# Patient Record
Sex: Male | Born: 1965
Health system: Southern US, Community
[De-identification: ages and names within clinical notes are randomized; demographics above are authoritative.]

## PROBLEM LIST (undated history)

## (undated) DIAGNOSIS — I10 Essential (primary) hypertension: Secondary | ICD-10-CM

## (undated) DIAGNOSIS — E119 Type 2 diabetes mellitus without complications: Secondary | ICD-10-CM

---

## 2005-04-28 ENCOUNTER — Emergency Department (HOSPITAL_COMMUNITY): Admission: EM | Admit: 2005-04-28 | Discharge: 2005-04-28 | Payer: Self-pay | Admitting: Emergency Medicine

## 2006-10-08 ENCOUNTER — Emergency Department (HOSPITAL_COMMUNITY): Admission: EM | Admit: 2006-10-08 | Discharge: 2006-10-09 | Payer: Self-pay | Admitting: Emergency Medicine

## 2008-01-04 ENCOUNTER — Emergency Department (HOSPITAL_COMMUNITY): Admission: EM | Admit: 2008-01-04 | Discharge: 2008-01-04 | Payer: Self-pay | Admitting: Family Medicine

## 2013-02-07 ENCOUNTER — Emergency Department (HOSPITAL_COMMUNITY): Payer: BC Managed Care – PPO

## 2013-02-07 ENCOUNTER — Emergency Department (HOSPITAL_COMMUNITY)
Admission: EM | Admit: 2013-02-07 | Discharge: 2013-02-07 | Disposition: A | Discharge status: Home / Self Care | Payer: BC Managed Care – PPO | Attending: Emergency Medicine | Admitting: Emergency Medicine

## 2013-02-07 DIAGNOSIS — I10 Essential (primary) hypertension: Secondary | ICD-10-CM | POA: Insufficient documentation

## 2013-02-07 DIAGNOSIS — K219 Gastro-esophageal reflux disease without esophagitis: Secondary | ICD-10-CM | POA: Insufficient documentation

## 2013-02-07 LAB — COMPREHENSIVE METABOLIC PANEL
ALT: 11 U/L (ref 0–53)
AST: 15 U/L (ref 0–37)
Albumin: 3.9 g/dL (ref 3.5–5.2)
Alkaline Phosphatase: 59 U/L (ref 39–117)
CO2: 26 mEq/L (ref 19–32)
Calcium: 9.3 mg/dL (ref 8.4–10.5)
Creatinine, Ser: 1.14 mg/dL (ref 0.50–1.35)
GFR calc Af Amer: 88 mL/min — ABNORMAL LOW (ref >90)
GFR calc non Af Amer: 76 mL/min — ABNORMAL LOW (ref >90)
Glucose, Bld: 111 mg/dL — ABNORMAL HIGH (ref 70–99)
Potassium: 3.6 mEq/L (ref 3.5–5.1)
Sodium: 138 mEq/L (ref 135–145)
Total Bilirubin: 0.5 mg/dL (ref 0.3–1.2)
Total Protein: 7.5 g/dL (ref 6.0–8.3)

## 2013-02-07 LAB — CBC
HCT: 41.4 % (ref 39.0–52.0)
Hemoglobin: 14.5 g/dL (ref 13.0–17.0)
MCHC: 35 g/dL (ref 30.0–36.0)
MCV: 91.4 fL (ref 78.0–100.0)
RBC: 4.53 MIL/uL (ref 4.22–5.81)
WBC: 11.2 10*3/uL — ABNORMAL HIGH (ref 4.0–10.5)

## 2013-02-07 LAB — POCT I-STAT TROPONIN I: Troponin i, poc: 0 ng/mL (ref 0.00–0.08)

## 2013-02-07 NOTE — ED Provider Notes (Signed)
CSN: [id number]     Arrival date & time 02/07/13  0331 History   First MD Initiated Contact with Patient 02/07/13 [phone]     Chief Complaint  Patient presents with  . Dysphagia    The history is provided by the patient.   Pt reports that for past 3 nights he will wake up with burning in his chest that radiates into his neck and will cause reflux into his mouth.  It will last about 30 minutes then resolve and is improved with sitting up.  He also reports that for the past 24 hours he has had difficulty swallowing and had some burning in his chest along with swallowing.  No sob.  No fever/cough.  No drooling.  He denies sore throat He reports he just started taking prilosec and pepcid yesterday He denies NSAID abuse   He also mentions left scapular pain for past several weeks, no injury, pain seems worse with movement certain positions.  PMH - HTN Soc hx - no etoh use History  Substance Use Topics  . Smoking status: Not on file  . Smokeless tobacco: Not on file  . Alcohol Use: Not on file    Review of Systems  Constitutional: Negative for fever.  Respiratory: Negative for cough, choking and shortness of breath.   Gastrointestinal: Negative for vomiting and blood in stool.  Neurological: Negative for weakness.  All other systems reviewed and are negative.    Allergies  Shellfish allergy  Home Medications  No current outpatient prescriptions on file. BP 125/83  Pulse 69  Temp(Src) 98.1 F (36.7 C) (Oral)  Resp 16  SpO2 99% Physical Exam CONSTITUTIONAL: Well developed/well nourished HEAD: Normocephalic/atraumatic EYES: EOMI/PERRL ENMT: Mucous membranes moist NECK: supple no meningeal signs SPINE:entire spine nontender CV: S1/S2 noted, no murmurs/rubs/gallops noted LUNGS: Lungs are clear to auscultation bilaterally, no apparent distress ABDOMEN: soft, nontender, no rebound or guarding NEURO: Pt is awake/alert, moves all extremitiesx4 EXTREMITIES: pulses normal, full  ROM, mild tenderness to left scapula, no bruising noted SKIN: warm, color normal PSYCH: no abnormalities of mood noted  ED Course  Procedures  Labs Review Labs Reviewed  CBC - Abnormal; Notable for the following:    WBC 11.2 (*)    All other components within normal limits  COMPREHENSIVE METABOLIC PANEL - Abnormal; Notable for the following:    Glucose, Bld 111 (*)    GFR calc non Af Amer 76 (*)    GFR calc Af Amer 88 (*)    All other components within normal limits  POCT I-STAT TROPONIN I   Imaging Review Dg Chest 2 View  02/07/2013   CLINICAL DATA:  Dysphagia.  Back pain.  EXAM: CHEST  2 VIEW  COMPARISON:  03/07/2011  FINDINGS: Band of opacity in the right middle lobe, consistent with atelectasis. No consolidation, edema, effusion, or pneumothorax. Normal heart size and mediastinal contours. Remote lateral left rib fractures.  IMPRESSION: Mild right middle lobe atelectasis.   Electronically Signed   By: Tiburcio Pea   On: 02/07/2013 04:57  pt well appearing, no distress.  He reports symptoms improved when he sat up.  He reports he eats heavy meals and will go to bed.  He self diagnosed himself with reflux and I suspect this may be his issue.  I told him to continue prilosec and he may need referral to [facility] I doubt ACS at this time.  Stable for d/c home  MDM  No diagnosis found. Nursing notes including past medical  history and social history reviewed and considered in documentation xrays reviewed and considered Labs/vital reviewed and considered    Date: 02/07/2013 0338am  Rate: 75  Rhythm: normal sinus rhythm  QRS Axis: normal  Intervals: normal  ST/T Wave abnormalities: early repolarization  Conduction Disutrbances:none  Narrative Interpretation:   Old EKG Reviewed: none available at time of interpretation    Joya Gaskins, MD 02/07/13 [phone]

## 2013-02-07 NOTE — ED Notes (Signed)
Pt reports that he woke from deep sleep with reflux and difficulty swallowing, with left side shoulder pain that radiates to left neck. Swallowing difficulty resolved. Denies nausea, vomiting, SOB, weakness, diaphoresis. Pain exacerbated by nothing and relived by nothing.

## 2014-11-30 ENCOUNTER — Encounter (HOSPITAL_COMMUNITY): Payer: Self-pay | Admitting: Emergency Medicine

## 2014-11-30 ENCOUNTER — Emergency Department (HOSPITAL_COMMUNITY)
Admission: EM | Admit: 2014-11-30 | Discharge: 2014-11-30 | Disposition: A | Discharge status: Home / Self Care | Payer: BC Managed Care – PPO | Source: Home / Self Care

## 2014-11-30 DIAGNOSIS — J039 Acute tonsillitis, unspecified: Secondary | ICD-10-CM | POA: Diagnosis not present

## 2014-11-30 DIAGNOSIS — J029 Acute pharyngitis, unspecified: Secondary | ICD-10-CM

## 2014-11-30 HISTORY — DX: Essential (primary) hypertension: I10

## 2014-11-30 MED ORDER — IPRATROPIUM BROMIDE 0.06 % NA SOLN: 2.0000 | Freq: Four times a day (QID) | NASAL | Status: DC

## 2014-11-30 MED ORDER — AMOXICILLIN 500 MG PO CAPS: 500.0000 mg | ORAL_CAPSULE | Freq: Two times a day (BID) | ORAL | Status: DC

## 2014-11-30 NOTE — Discharge Instructions (Signed)
The cause of your symptoms are likely from a virus but may be from strep throat. Your initial Strep test was negative also this for culture and call you if it is positive. Please start the nasal Atrovent as well as 800 mg of ibuprofen every 6-8 hours as well as nasal saline and antibiotic allergy medicine such as Zyrtec. Symptoms do not improve you may start the antibiotics.

## 2014-11-30 NOTE — ED Provider Notes (Signed)
CSN: [id number]     Arrival date & time 11/30/14  1851 History   None    No chief complaint on file.  (Consider location/radiation/quality/duration/timing/severity/associated sxs/prior Treatment) HPI  3 days of sore throat, fevers chills,. Denies cough, shortness breath, chest pain, palpitations, rash, neck stiffness, symptoms are constant and getting worse ibuprofen 400 mg without improvement. Difficult to swallow.   No past medical history on file. No past surgical history on file. No family history on file. History  Substance Use Topics  . Smoking status: Not on file  . Smokeless tobacco: Not on file  . Alcohol Use: Not on file    Review of Systems Per HPI with all other pertinent systems negative.   Allergies  Shellfish allergy  Home Medications   Prior to Admission medications   Medication Sig Start Date End Date Taking? Authorizing Provider  albuterol (PROVENTIL HFA;VENTOLIN HFA) 108 (90 BASE) MCG/ACT inhaler Inhale 2 puffs into the lungs every 6 (six) hours as needed for wheezing.    Historical Provider, MD  amLODipine-benazepril (LOTREL) 10-40 MG per capsule Take 1 capsule by mouth daily.    Historical Provider, MD  amoxicillin (AMOXIL) 500 MG capsule Take 1 capsule (500 mg total) by mouth 2 (two) times daily. 11/30/14   Waldemar Dickens, MD  beclomethasone (QVAR) 80 MCG/ACT inhaler Inhale 1 puff into the lungs 2 (two) times daily.    Historical Provider, MD  EPINEPHrine (EPI-PEN) 0.3 mg/0.3 mL SOAJ injection Inject 0.3 mg into the muscle once.    Historical Provider, MD  hydrochlorothiazide (HYDRODIURIL) 25 MG tablet Take 12.5 mg by mouth daily.    Historical Provider, MD  ipratropium (ATROVENT) 0.06 % nasal spray Place 2 sprays into both nostrils 4 (four) times daily. 11/30/14   Waldemar Dickens, MD   BP 140/88 mmHg  Pulse 98  Temp(Src) 98.5 F (36.9 C) (Oral)  Resp 20  SpO2 96% Physical Exam Physical Exam  Constitutional: oriented to person, place, and time.  appears well-developed and well-nourished. No distress.  HENT:  Tonsils 2-3+ with copious exudate, anterior cervical lymphadenopathy, maxillary and frontal sinus tenderness to palpation. Head: Normocephalic and atraumatic.  Eyes: EOMI. PERRL.  Neck: Normal range of motion.  Cardiovascular: RRR, no m/r/g, 2+ distal pulses,  Pulmonary/Chest: Effort normal and breath sounds normal. No respiratory distress.  Abdominal: Soft. Bowel sounds are normal. NonTTP, no distension.  Musculoskeletal: Normal range of motion. Non ttp, no effusion.  Neurological: alert and oriented to person, place, and time.  Skin: Skin is warm. No rash noted. non diaphoretic.  Psychiatric: normal mood and affect. behavior is normal. Judgment and thought content normal.   ED Course  Procedures (including critical care time) Labs Review Labs Reviewed - No data to display  Imaging Review No results found.   MDM   1. Sore throat   2. Tonsillitis    Antibiotics, nasal Atrovent, strep culture sent, NSAIDs.    Waldemar Dickens, MD 11/30/14 2039

## 2014-11-30 NOTE — ED Notes (Signed)
Pt has been suffering from a sore throat and nasal congestion for four days.  Pt denies any fever.

## 2014-12-02 LAB — CULTURE, GROUP A STREP

## 2015-04-05 ENCOUNTER — Ambulatory Visit: Payer: BC Managed Care – PPO | Admitting: Skilled Nursing Facility1

## 2016-05-22 ENCOUNTER — Other Ambulatory Visit: Payer: Self-pay | Admitting: Internal Medicine

## 2016-05-22 ENCOUNTER — Ambulatory Visit
Admission: RE | Admit: 2016-05-22 | Discharge: 2016-05-22 | Disposition: A | Discharge status: Home / Self Care | Payer: BC Managed Care – PPO | Source: Ambulatory Visit | Attending: Internal Medicine | Admitting: Internal Medicine

## 2016-05-22 DIAGNOSIS — R0789 Other chest pain: Secondary | ICD-10-CM

## 2016-08-15 ENCOUNTER — Other Ambulatory Visit: Payer: Self-pay | Admitting: Gastroenterology

## 2016-10-01 ENCOUNTER — Ambulatory Visit (HOSPITAL_COMMUNITY): Admit: 2016-10-01 | Payer: BC Managed Care – PPO | Admitting: Gastroenterology

## 2016-10-01 ENCOUNTER — Encounter (HOSPITAL_COMMUNITY): Payer: Self-pay

## 2016-10-01 SURGERY — COLONOSCOPY WITH PROPOFOL
Anesthesia: Monitor Anesthesia Care

## 2016-10-21 ENCOUNTER — Encounter (HOSPITAL_COMMUNITY): Payer: Self-pay | Admitting: *Deleted

## 2016-10-22 ENCOUNTER — Ambulatory Visit (HOSPITAL_COMMUNITY)
Admission: RE | Admit: 2016-10-22 | Discharge: 2016-10-22 | Disposition: A | Discharge status: Home / Self Care | Payer: BC Managed Care – PPO | Source: Ambulatory Visit | Attending: Gastroenterology | Admitting: Gastroenterology

## 2016-10-22 ENCOUNTER — Encounter (HOSPITAL_COMMUNITY): Payer: Self-pay | Admitting: *Deleted

## 2016-10-22 ENCOUNTER — Ambulatory Visit (HOSPITAL_COMMUNITY): Payer: BC Managed Care – PPO | Admitting: Certified Registered Nurse Anesthetist

## 2016-10-22 ENCOUNTER — Encounter (HOSPITAL_COMMUNITY)
Admission: RE | Disposition: A | Discharge status: Home / Self Care | Payer: Self-pay | Source: Ambulatory Visit | Attending: Gastroenterology

## 2016-10-22 DIAGNOSIS — I10 Essential (primary) hypertension: Secondary | ICD-10-CM | POA: Diagnosis not present

## 2016-10-22 DIAGNOSIS — Z1211 Encounter for screening for malignant neoplasm of colon: Secondary | ICD-10-CM | POA: Insufficient documentation

## 2016-10-22 DIAGNOSIS — E119 Type 2 diabetes mellitus without complications: Secondary | ICD-10-CM | POA: Insufficient documentation

## 2016-10-22 DIAGNOSIS — D125 Benign neoplasm of sigmoid colon: Secondary | ICD-10-CM | POA: Diagnosis not present

## 2016-10-22 DIAGNOSIS — Z87891 Personal history of nicotine dependence: Secondary | ICD-10-CM | POA: Insufficient documentation

## 2016-10-22 DIAGNOSIS — E7800 Pure hypercholesterolemia, unspecified: Secondary | ICD-10-CM | POA: Insufficient documentation

## 2016-10-22 HISTORY — DX: Type 2 diabetes mellitus without complications: E11.9

## 2016-10-22 HISTORY — PX: COLONOSCOPY WITH PROPOFOL: SHX5780

## 2016-10-22 LAB — GLUCOSE, CAPILLARY: Glucose-Capillary: 111 mg/dL — ABNORMAL HIGH (ref 65–99)

## 2016-10-22 SURGERY — COLONOSCOPY WITH PROPOFOL
Anesthesia: Monitor Anesthesia Care

## 2016-10-22 MED ORDER — ONDANSETRON HCL 4 MG/2ML IJ SOLN
INTRAMUSCULAR | Status: DC | PRN
  Administered 2016-10-22: 4 mg via INTRAVENOUS

## 2016-10-22 MED ORDER — LIDOCAINE 2% (20 MG/ML) 5 ML SYRINGE
INTRAMUSCULAR | Status: DC | PRN
  Administered 2016-10-22: 40 mg via INTRAVENOUS

## 2016-10-22 MED ORDER — LACTATED RINGERS IV SOLN
INTRAVENOUS | Status: DC | PRN
  Administered 2016-10-22: 09:00:00 via INTRAVENOUS

## 2016-10-22 MED ORDER — ONDANSETRON HCL 4 MG/2ML IJ SOLN
INTRAMUSCULAR | Status: AC
  Filled 2016-10-22: qty 2

## 2016-10-22 MED ORDER — PROPOFOL 10 MG/ML IV BOLUS
INTRAVENOUS | Status: AC
  Filled 2016-10-22: qty 20

## 2016-10-22 MED ORDER — PROPOFOL 500 MG/50ML IV EMUL
INTRAVENOUS | Status: DC | PRN
  Administered 2016-10-22: 100 ug/kg/min via INTRAVENOUS

## 2016-10-22 MED ORDER — PROPOFOL 10 MG/ML IV BOLUS
INTRAVENOUS | Status: AC
  Filled 2016-10-22: qty 40

## 2016-10-22 SURGICAL SUPPLY — 22 items

## 2016-10-22 NOTE — Discharge Instructions (Signed)
Colonoscopy, Adult, Care After  This sheet gives you information about how to care for yourself after your procedure. Your health care provider may also give you more specific instructions. If you have problems or questions, contact your health care provider.  What can I expect after the procedure?  After the procedure, it is common to have:  · A small amount of blood in your stool for 24 hours after the procedure.  · Some gas.  · Mild abdominal cramping or bloating.    Follow these instructions at home:  General instructions    · For the first 24 hours after the procedure:  ? Do not drive or use machinery.  ? Do not sign important documents.  ? Do not drink alcohol.  ? Do your regular daily activities at a slower pace than normal.  ? Eat soft, easy-to-digest foods.  ? Rest often.  · Take over-the-counter or prescription medicines only as told by your health care provider.  · It is up to you to get the results of your procedure. Ask your health care provider, or the department performing the procedure, when your results will be ready.  Relieving cramping and bloating  · Try walking around when you have cramps or feel bloated.  · Apply heat to your abdomen as told by your health care provider. Use a heat source that your health care provider recommends, such as a moist heat pack or a heating pad.  ? Place a towel between your skin and the heat source.  ? Leave the heat on for 20-30 minutes.  ? Remove the heat if your skin turns bright red. This is especially important if you are unable to feel pain, heat, or cold. You may have a greater risk of getting burned.  Eating and drinking  · Drink enough fluid to keep your urine clear or pale yellow.  · Resume your normal diet as instructed by your health care provider. Avoid heavy or fried foods that are hard to digest.  · Avoid drinking alcohol for as long as instructed by your health care provider.  Contact a health care provider if:  · You have blood in your stool 2-3  days after the procedure.  Get help right away if:  · You have more than a small spotting of blood in your stool.  · You pass large blood clots in your stool.  · Your abdomen is swollen.  · You have nausea or vomiting.  · You have a fever.  · You have increasing abdominal pain that is not relieved with medicine.  This information is not intended to replace advice given to you by your health care provider. Make sure you discuss any questions you have with your health care provider.  Document Released: 12/19/2003 Document Revised: 01/29/2016 Document Reviewed: 07/18/2015  Elsevier Interactive Patient Education © 2018 [employer]

## 2016-10-22 NOTE — Anesthesia Postprocedure Evaluation (Signed)
Anesthesia Post Note  Patient: Ronald Espinoza  Procedure(s) Performed: Procedure(s) (LRB): COLONOSCOPY WITH PROPOFOL (N/A)     Patient location during evaluation: Endoscopy Anesthesia Type: MAC Level of consciousness: awake and alert Pain management: pain level controlled Vital Signs Assessment: post-procedure vital signs reviewed and stable Respiratory status: spontaneous breathing, nonlabored ventilation, respiratory function stable and patient connected to nasal cannula oxygen Cardiovascular status: stable and blood pressure returned to baseline Anesthetic complications: no    Last Vitals:  Vitals:   10/22/16 1010 10/22/16 1015  BP: 124/71   Pulse: 73 68  Resp: 16 16  Temp:  36.6 C    Last Pain:  Vitals:   10/22/16 0851  TempSrc: Oral                 Montez Hageman

## 2016-10-22 NOTE — Anesthesia Preprocedure Evaluation (Signed)
Anesthesia Evaluation  Patient identified by MRN, date of birth, ID band Patient awake    Reviewed: Allergy & Precautions, NPO status , Patient's Chart, lab work & pertinent test results  Airway Mallampati: II  TM Distance: >3 FB Neck ROM: Full    Dental no notable dental hx.    Pulmonary neg pulmonary ROS, former smoker,    Pulmonary exam normal breath sounds clear to auscultation       Cardiovascular hypertension, Pt. on medications negative cardio ROS Normal cardiovascular exam Rhythm:Regular Rate:Normal     Neuro/Psych negative neurological ROS  negative psych ROS   GI/Hepatic negative GI ROS, Neg liver ROS,   Endo/Other  negative endocrine ROSdiabetes, Type 2, Oral Hypoglycemic Agents  Renal/GU negative Renal ROS  negative genitourinary   Musculoskeletal negative musculoskeletal ROS (+)   Abdominal   Peds negative pediatric ROS (+)  Hematology negative hematology ROS (+)   Anesthesia Other Findings   Reproductive/Obstetrics negative OB ROS                             Anesthesia Physical Anesthesia Plan  ASA: II  Anesthesia Plan: MAC   Post-op Pain Management:    Induction: Intravenous  PONV Risk Score and Plan: 0  Airway Management Planned:   Additional Equipment:   Intra-op Plan:   Post-operative Plan:   Informed Consent: I have reviewed the patients History and Physical, chart, labs and discussed the procedure including the risks, benefits and alternatives for the proposed anesthesia with the patient or authorized representative who has indicated his/her understanding and acceptance.   Dental advisory given  Plan Discussed with: CRNA  Anesthesia Plan Comments:         Anesthesia Quick Evaluation

## 2016-10-22 NOTE — Transfer of Care (Signed)
Immediate Anesthesia Transfer of Care Note  Patient: Ronald Espinoza  Procedure(s) Performed: Procedure(s): COLONOSCOPY WITH PROPOFOL (N/A)  Patient Location: PACU  Anesthesia Type:MAC  Level of Consciousness:  sedated, patient cooperative and responds to stimulation  Airway & Oxygen Therapy:Patient Spontanous Breathing and Patient connected to face mask oxgen  Post-op Assessment:  Report given to PACU RN and Post -op Vital signs reviewed and stable  Post vital signs:  Reviewed and stable  Last Vitals:  Vitals:   10/22/16 0851  BP: (!) 146/92  Resp: (!) 21  Temp: 03.4 C    Complications: No apparent anesthesia complications

## 2016-10-22 NOTE — Op Note (Signed)
[facility] Patient Name: Ronald Espinoza Procedure Date: 10/22/2016 MRN: [id number] Attending MD: Garlan Fair , MD Date of Birth: 11-Jun-1965 CSN: [id number] Age: 51 Admit Type: Outpatient Procedure:                Colonoscopy Indications:              Screening for colorectal malignant neoplasm Providers:                Garlan Fair, MD, Kingsley Plan, RN, Lillie Fragmin, RN, Cherylynn Ridges, Technician, Christell Faith, CRNA Referring MD:              Medicines:                Propofol per Anesthesia Complications:            No immediate complications. Estimated Blood Loss:     Estimated blood loss was minimal. Procedure:                Pre-Anesthesia Assessment:                           - Prior to the procedure, a History and Physical                            was performed, and patient medications and                            allergies were reviewed. The patient's tolerance of                            previous anesthesia was also reviewed. The risks                            and benefits of the procedure and the sedation                            options and risks were discussed with the patient.                            All questions were answered, and informed consent                            was obtained. Prior Anticoagulants: The patient has                            taken aspirin, last dose was 1 day prior to                            procedure. ASA Grade Assessment: II - A patient  with mild systemic disease. After reviewing the                            risks and benefits, the patient was deemed in                            satisfactory condition to undergo the procedure.                           After obtaining informed consent, the colonoscope                            was passed under direct vision. Throughout the                            procedure,  the patient's blood pressure, pulse, and                            oxygen saturations were monitored continuously. The                            EC-[id number] ([id number]) scope was introduced through                            the anus and advanced to the the cecum, identified                            by appendiceal orifice and ileocecal valve. The                            colonoscopy was performed without difficulty. The                            patient tolerated the procedure well. The quality                            of the bowel preparation was good. The ileocecal                            valve, the appendiceal orifice and the rectum were                            photographed. Scope In: 9:25:11 AM Scope Out: 9:48:30 AM Scope Withdrawal Time: 0 hours 15 minutes 44 seconds  Total Procedure Duration: 0 hours 23 minutes 19 seconds  Findings:      The perianal and digital rectal examinations were normal.      A 4 mm polyp was found in the proximal sigmoid colon. The polyp was       semi-pedunculated. The polyp was removed with a cold snare. Resection       and retrieval were complete.      The exam was otherwise without abnormality. Impression:               - One 4 mm polyp in the proximal sigmoid colon,  removed with a cold snare. Resected and retrieved.                           - The examination was otherwise normal. Moderate Sedation:      N/A- Per Anesthesia Care Recommendation:           - Patient has a contact number available for                            emergencies. The signs and symptoms of potential                            delayed complications were discussed with the                            patient. Return to normal activities tomorrow.                            Written discharge instructions were provided to the                            patient.                           - Repeat colonoscopy date to be determined after                             pending pathology results are reviewed for                            surveillance.                           - Resume previous diet.                           - Continue present medications. Procedure Code(s):        --- Professional ---                           [id number], Colonoscopy, flexible; with removal of                            tumor(s), polyp(s), or other lesion(s) by snare                            technique Diagnosis Code(s):        --- Professional ---                           Z12.11, Encounter for screening for malignant                            neoplasm of colon                           D12.5, Benign neoplasm of sigmoid colon  CPT copyright 2016 American Medical Association. All rights reserved. The codes documented in this report are preliminary and upon coder review may  be revised to meet current compliance requirements. Earle Gell, MD Garlan Fair, MD 10/22/2016 9:53:53 AM This report has been signed electronically. Number of Addenda: 0

## 2016-10-22 NOTE — H&P (Signed)
Procedure: Baseline screening colonoscopy  History: The patient is a 51 year old male born 05/20/1966. He is scheduled to undergo his first screening colonoscopy with polypectomy to prevent colon cancer. There is no family history of colon cancer.  Past medical history: Hypertension. Hypercholesterolemia. Type 2 diabetes mellitus diagnosed in 2017.  Habits: The patient quit smoking cigarettes in 2012  Exam: The patient is alert and lying comfortably on the endoscopy stretcher. Abdomen is soft and nontender to palpation. Lungs are clear to auscultation. Cardiac exam reveals a regular rhythm.  Plan: Proceed with baseline screening colonoscopy

## 2016-10-23 ENCOUNTER — Encounter (HOSPITAL_COMMUNITY): Payer: Self-pay | Admitting: Gastroenterology

## 2016-12-23 ENCOUNTER — Encounter: Payer: BC Managed Care – PPO | Attending: Internal Medicine | Admitting: Registered"

## 2016-12-23 ENCOUNTER — Encounter: Payer: Self-pay | Admitting: Registered"

## 2016-12-23 DIAGNOSIS — E119 Type 2 diabetes mellitus without complications: Secondary | ICD-10-CM | POA: Insufficient documentation

## 2016-12-23 DIAGNOSIS — Z713 Dietary counseling and surveillance: Secondary | ICD-10-CM | POA: Diagnosis not present

## 2016-12-23 DIAGNOSIS — I10 Essential (primary) hypertension: Secondary | ICD-10-CM | POA: Insufficient documentation

## 2016-12-23 NOTE — Progress Notes (Signed)
Diabetes Self-Management Education  Visit Type: First/Initial  Appt. Start Time: 1415 Appt. End Time: 5329  12/23/2016  Mr. Steffanie Rainwater, identified by name and date of birth, is a 51 y.o. male with a diagnosis of Diabetes: Type 2.   ASSESSMENT Pt states his A1c was 7% in Jan, per chart it is now 6.8%. Pt states since his diagnosis he has started walking more at work- 6-10K steps 1-2 days per week, has cut back on fried food and sugary drinks.  Pt states he is a fast eater. Pt reports he likes to cook, but eats out often.      Diabetes Self-Management Education - 12/23/16 1424      Visit Information   Visit Type First/Initial     Initial Visit   Diabetes Type Type 2   Are you currently following a meal plan? No   Are you taking your medications as prescribed? Yes   Date Diagnosed June 2017     Health Coping   How would you rate your overall health? Fair     Psychosocial Assessment   Patient Belief/Attitude about Diabetes Motivated to manage diabetes   How often do you need to have someone help you when you read instructions, pamphlets, or other written materials from your doctor or pharmacy? 1 - Never   What is the last grade level you completed in school? 2 yrs trade school     Complications   Last HgB A1C per patient/outside source 6.8 %  per referral labs   How often do you check your blood sugar? 0 times/day (not testing)   Have you had a dilated eye exam in the past 12 months? Yes   Have you had a dental exam in the past 12 months? Yes   Are you checking your feet? Yes   How many days per week are you checking your feet? 5     Dietary Intake   Breakfast none OR [place] biscuit ham & egg, hashbrown, coffee   Snack (morning) none (try) was taking fruit OR almonds (not too hungry)   Lunch left overs OR j[employer] OR burger, fries, used to get drink   Snack (afternoon) nachos salsa OR cheese   Dinner Fied chicken collard greens OR risoto OR [place] Lamb Stew  OR [place] [employer] OR olive garden   Snack (evening) toast OR meat & cheese   Beverage(s) water, coffee, OJ, milk 2%     Exercise   Exercise Type Light (walking / raking leaves)   How many days per week to you exercise? 2   How many minutes per day do you exercise? 15   Total minutes per week of exercise 30     Patient Education   Previous Diabetes Education No   Disease state  Definition of diabetes, type 1 and 2, and the diagnosis of diabetes;Factors that contribute to the development of diabetes   Nutrition management  Role of diet in the treatment of diabetes and the relationship between the three main macronutrients and blood glucose level;Carbohydrate counting   Physical activity and exercise  Role of exercise on diabetes management, blood pressure control and cardiac health.   Monitoring Identified appropriate SMBG and/or A1C goals.   Chronic complications Relationship between chronic complications and blood glucose control     Individualized Goals (developed by patient)   Nutrition General guidelines for healthy choices and portions discussed   Physical Activity Exercise 5-7 days per week     Outcomes   Expected  Outcomes Demonstrated interest in learning. Expect positive outcomes   Future DMSE 4-6 wks   Program Status Not Completed    Individualized Plan for Diabetes Self-Management Training:   Learning Objective:  Patient will have a greater understanding of diabetes self-management. Patient education plan is to attend individual and/or group sessions per assessed needs and concerns.  Patient Instructions  Consider having a English muffin instead of biscuit and not having the hashbrowns Aim to eat mindfully at least one meal per week  Aim to balance meals with carbohydrates and protein Aim to increase your physical activity daily as tolerated Consider using alternative seasonings  Expected Outcomes:  Demonstrated interest in learning. Expect positive  outcomes  Education material provided: Living Well with Diabetes, A1C conversion sheet, My Plate, Carbohydrate counting sheet and No sodium seasonings  If problems or questions, patient to contact team via:  Phone and MyChart  Future DSME appointment: 4-6 wks

## 2016-12-23 NOTE — Patient Instructions (Addendum)
Consider having a English muffin instead of biscuit and not having the hashbrowns Aim to eat mindfully at least one meal per week  Aim to balance meals with carbohydrates and protein Aim to increase your physical activity daily as tolerated Consider using alternative seasonings

## 2017-01-27 ENCOUNTER — Encounter: Payer: BC Managed Care – PPO | Attending: Internal Medicine | Admitting: Registered"

## 2017-01-27 DIAGNOSIS — Z713 Dietary counseling and surveillance: Secondary | ICD-10-CM | POA: Insufficient documentation

## 2017-01-27 DIAGNOSIS — I10 Essential (primary) hypertension: Secondary | ICD-10-CM | POA: Insufficient documentation

## 2017-01-27 DIAGNOSIS — E119 Type 2 diabetes mellitus without complications: Secondary | ICD-10-CM | POA: Diagnosis not present

## 2017-01-27 NOTE — Progress Notes (Signed)
Diabetes Self-Management Education  Visit Type: Follow-up  Appt. Start Time: 1400 Appt. End Time: 1430  01/27/2017  [name], identified by name and date of birth, is a 51 y.o. male with a diagnosis of Diabetes:  .   ASSESSMENT Pt states he has not started working out yet. Pt reports when he gets home from work he likes to relax. Pt states he has been snacking maybe out of boredom, chips and salsa. RD discussed other options and getting a hobby may be a good idea. Pt states he started having sweet tea at night as he was enjoying perfecting a recipe. Pt states they had pasta for dinner, but he ate smaller serving size that he had in the past. Pt misunderstood advice about snacks and has been eating fruit alone for snacks.      Diabetes Self-Management Education - 01/27/17 1727      Visit Information   Visit Type Follow-up     Patient Education   Nutrition management  Food label reading, portion sizes and measuring food.   Physical activity and exercise  Role of exercise on diabetes management, blood pressure control and cardiac health.   Monitoring Identified appropriate SMBG and/or A1C goals.   Psychosocial adjustment Worked with patient to identify barriers to care and solutions     Individualized Goals (developed by patient)   Nutrition Follow meal plan discussed   Physical Activity Exercise 3-5 times per week     Patient Self-Evaluation of Goals - Patient rates self as meeting previously set goals (% of time)   Nutrition 25 - 50%   Physical Activity < 25%   Medications >75%   Monitoring < 25%     Outcomes   Expected Outcomes Demonstrated interest in learning. Expect positive outcomes   Future DMSE 2 months   Program Status Completed     Subsequent Visit   Since your last visit have you continued or begun to take your medications as prescribed? Yes   Since your last visit, are you checking your blood glucose at least once a day? No  meter not working, RD encouraged  to get a new one    Individualized Plan for Diabetes Self-Management Training:   Learning Objective:  Patient will have a greater understanding of diabetes self-management. Patient education plan is to attend individual and/or group sessions per assessed needs and concerns.   Patient Instructions  Snacks - make sure you have enough protein to balance your carbs Consider checking your blood sugar a few times a week Consider working the exercise into your routine  Expected Outcomes:  Demonstrated interest in learning. Expect positive outcomes  Education material provided: Food label handouts and A1C conversion sheet  If problems or questions, patient to contact team via:  Phone  Future DSME appointment: 2 months

## 2017-01-27 NOTE — Patient Instructions (Addendum)
Snacks - make sure you have enough protein to balance your carbs Consider checking your blood sugar a few times a week Consider working the exercise into your routine

## 2017-02-26 ENCOUNTER — Ambulatory Visit: Payer: BC Managed Care – PPO | Admitting: Registered"

## 2017-03-12 ENCOUNTER — Encounter (HOSPITAL_COMMUNITY): Payer: Self-pay | Admitting: Emergency Medicine

## 2017-03-12 ENCOUNTER — Ambulatory Visit (HOSPITAL_COMMUNITY)
Admission: EM | Admit: 2017-03-12 | Discharge: 2017-03-12 | Disposition: A | Discharge status: Home / Self Care | Payer: BC Managed Care – PPO | Attending: Family Medicine | Admitting: Family Medicine

## 2017-03-12 DIAGNOSIS — K649 Unspecified hemorrhoids: Secondary | ICD-10-CM | POA: Diagnosis not present

## 2017-03-12 NOTE — ED Triage Notes (Signed)
Pt reports external rectal bleeding, possibly from a hemorrhoid that started this morning.  He has had the hemorrhoid since Saturday.

## 2017-03-13 NOTE — ED Provider Notes (Signed)
  [place]   [id number] 03/12/17 Arrival Time: 8466  ASSESSMENT & PLAN:  1. Acute hemorrhoid    Observation. OTC ointment as needed. Colonoscopy this year; normal per patient report. If worsening will f/u with PCP or here. Reviewed expectations re: course of current medical issues. Questions answered. Outlined signs and symptoms indicating need for more acute intervention. Patient verbalized understanding. After Visit Summary given.   SUBJECTIVE:  Ronald Espinoza is a 51 y.o. male who presents with complaint of possible hemorrhoid. Noticed BRB on toilet paper this morning after straining with BM. Has been noticing hard stool for the past few days. Slight pain with BM. No n/v. Afebrile. No abdominal pain. Questions hemorrhoid. No OTC treatment.  ROS: As per HPI.   OBJECTIVE:  Vitals:   03/12/17 1422  BP: (!) 156/105  Pulse: 90  Temp: 98.9 F (37.2 C)  TempSrc: Oral  SpO2: 96%    General appearance: alert; no distress Eyes: PERRLA; EOMI; conjunctiva normal HENT: normocephalic; atraumatic; TMs normal; nasal mucosa normal; oral mucosa normal Neck: supple Lungs: clear to auscultation bilaterally Heart: regular rate and rhythm Abdomen: soft, non-tender; bowel sounds normal; no masses or organomegaly; no guarding or rebound tenderness GU: rectal exam with external hemorrhoid, non-thrombosed, some BRB Back: no CVA tenderness Extremities: no cyanosis or edema; symmetrical with no gross deformities Skin: warm and dry Neurologic: normal gait; normal symmetric reflexes Psychological: alert and cooperative; normal mood and affect  Allergies  Allergen Reactions  . Shellfish Allergy Shortness Of Breath and Itching    Past Medical History:  Diagnosis Date  . Diabetes mellitus without complication ([place])   . Hypertension    Social History   Social History  . Marital status: Married    Spouse name: N/A  . Number of children: N/A  . Years of education: N/A    Occupational History  . Not on file.   Social History Main Topics  . Smoking status: Former Smoker    Quit date: 10/23/2010  . Smokeless tobacco: Never Used  . Alcohol use No  . Drug use: No  . Sexual activity: Yes   Other Topics Concern  . Not on file   Social History Narrative  . No narrative on file    Past Surgical History:  Procedure Laterality Date  . COLONOSCOPY WITH PROPOFOL N/A 10/22/2016   Procedure: COLONOSCOPY WITH PROPOFOL;  Surgeon: Garlan Fair, MD;  Location: [facility];  Service: Endoscopy;  Laterality: N/AVanessa Kick, MD 03/13/17 [phone]

## 2018-01-12 ENCOUNTER — Ambulatory Visit: Payer: BC Managed Care – PPO | Admitting: Podiatry

## 2018-01-12 ENCOUNTER — Encounter: Payer: Self-pay | Admitting: Podiatry

## 2018-01-12 DIAGNOSIS — B351 Tinea unguium: Secondary | ICD-10-CM

## 2018-01-12 DIAGNOSIS — M79674 Pain in right toe(s): Secondary | ICD-10-CM

## 2018-01-12 DIAGNOSIS — E119 Type 2 diabetes mellitus without complications: Secondary | ICD-10-CM

## 2018-01-12 NOTE — Progress Notes (Signed)
This patient presents to the office with chief complaint of long thick nail right foot  and diabetic feet.  This patient  says there  is  no pain and discomfort in his  feet.  This patient says there are long thick painful nails big toe right foot.  This nail is painful walking and wearing shoes.  Patient has no history of infection or drainage from both feet.  Patient is unable to  self treat his own nails . This patient presents  to the office today for treatment of the  Long big toenail  and a foot evaluation due to history of  diabetes.  General Appearance  Alert, conversant and in no acute stress.  Vascular  Dorsalis pedis and posterior tibial  pulses are palpable  bilaterally.  Capillary return is within normal limits  bilaterally. Temperature is within normal limits  bilaterally.  Neurologic  Senn-Weinstein monofilament wire test within normal limits  bilaterally. Muscle power within normal limits bilaterally.  Nails Thick disfigured discolored nails with subungual debris  from hallux to fifth toes bilaterally. No evidence of bacterial infection or drainage bilaterally.  Orthopedic  No limitations of motion of motion feet .  No crepitus or effusions noted.  No bony pathology or digital deformities noted.  Skin  normotropic skin with no porokeratosis noted bilaterally.  No signs of infections or ulcers noted.     Onychomycosis  Diabetes with no foot complications  IE  Debride nails x 1.  A diabetic foot exam was performed and there is no evidence of any vascular or neurologic pathology.   RTC prn   Gardiner Barefoot DPM

## 2018-11-16 ENCOUNTER — Other Ambulatory Visit: Payer: Self-pay

## 2018-11-16 ENCOUNTER — Observation Stay
Admission: EM | Admit: 2018-11-16 | Discharge: 2018-11-18 | Disposition: A | Discharge status: Home / Self Care | Payer: BC Managed Care – PPO | Attending: Internal Medicine | Admitting: Internal Medicine

## 2018-11-16 ENCOUNTER — Emergency Department: Payer: BC Managed Care – PPO

## 2018-11-16 DIAGNOSIS — Z87891 Personal history of nicotine dependence: Secondary | ICD-10-CM | POA: Insufficient documentation

## 2018-11-16 DIAGNOSIS — I2510 Atherosclerotic heart disease of native coronary artery without angina pectoris: Principal | ICD-10-CM | POA: Insufficient documentation

## 2018-11-16 DIAGNOSIS — Z7984 Long term (current) use of oral hypoglycemic drugs: Secondary | ICD-10-CM | POA: Diagnosis not present

## 2018-11-16 DIAGNOSIS — R0789 Other chest pain: Secondary | ICD-10-CM | POA: Diagnosis present

## 2018-11-16 DIAGNOSIS — Z1159 Encounter for screening for other viral diseases: Secondary | ICD-10-CM | POA: Diagnosis not present

## 2018-11-16 DIAGNOSIS — Z79899 Other long term (current) drug therapy: Secondary | ICD-10-CM | POA: Insufficient documentation

## 2018-11-16 DIAGNOSIS — R079 Chest pain, unspecified: Secondary | ICD-10-CM | POA: Diagnosis present

## 2018-11-16 DIAGNOSIS — M25512 Pain in left shoulder: Secondary | ICD-10-CM | POA: Diagnosis not present

## 2018-11-16 DIAGNOSIS — E119 Type 2 diabetes mellitus without complications: Secondary | ICD-10-CM | POA: Diagnosis not present

## 2018-11-16 DIAGNOSIS — I10 Essential (primary) hypertension: Secondary | ICD-10-CM | POA: Diagnosis not present

## 2018-11-16 DIAGNOSIS — R7989 Other specified abnormal findings of blood chemistry: Secondary | ICD-10-CM | POA: Insufficient documentation

## 2018-11-16 DIAGNOSIS — R778 Other specified abnormalities of plasma proteins: Secondary | ICD-10-CM

## 2018-11-16 LAB — CBC
HCT: 43 % (ref 39.0–52.0)
Hemoglobin: 14.5 g/dL (ref 13.0–17.0)
MCH: 30.9 pg (ref 26.0–34.0)
MCHC: 33.7 g/dL (ref 30.0–36.0)
MCV: 91.7 fL (ref 80.0–100.0)
Platelets: 193 10*3/uL (ref 150–400)
RBC: 4.69 MIL/uL (ref 4.22–5.81)
RDW: 14.4 % (ref 11.5–15.5)
WBC: 10.2 10*3/uL (ref 4.0–10.5)
nRBC: 0 % (ref 0.0–0.2)

## 2018-11-16 LAB — BASIC METABOLIC PANEL
Anion gap: 8 (ref 5–15)
BUN: 25 mg/dL — ABNORMAL HIGH (ref 6–20)
CO2: 26 mmol/L (ref 22–32)
Calcium: 9 mg/dL (ref 8.9–10.3)
Chloride: 106 mmol/L (ref 98–111)
Creatinine, Ser: 1.35 mg/dL — ABNORMAL HIGH (ref 0.61–1.24)
GFR calc Af Amer: >60 mL/min (ref >60)
GFR calc non Af Amer: 60 mL/min — ABNORMAL LOW (ref >60)
Glucose, Bld: 137 mg/dL — ABNORMAL HIGH (ref 70–99)
Potassium: 3.5 mmol/L (ref 3.5–5.1)
Sodium: 140 mmol/L (ref 135–145)

## 2018-11-16 LAB — TROPONIN I (HIGH SENSITIVITY): Troponin I (High Sensitivity): 7 ng/L (ref <18)

## 2018-11-16 MED ORDER — ASPIRIN 81 MG PO CHEW
324.0000 mg | CHEWABLE_TABLET | Freq: Once | ORAL | Status: AC
  Administered 2018-11-16: 324 mg via ORAL
  Filled 2018-11-16: qty 4

## 2018-11-16 NOTE — ED Triage Notes (Signed)
Pt arrives to ED via POV from home with c/o left shoulder pain with radiation down the left arm x1 hr that started when he "received a phone call from work" that might "have stressed me out". Pt denies CP, no SHOB. Pt denies N/V/D; no diaphoresis. Pt denies cardiac h/x. Pt is A&O, in NAD; RR even, regular and unlabored.

## 2018-11-16 NOTE — H&P (Signed)
[facility] [place] NAME: Ronald Espinoza    MR#:  [id number]  DATE OF BIRTH:  1966-05-17  DATE OF ADMISSION:  11/16/2018  PRIMARY CARE PHYSICIAN: Wenda Low, MD   REQUESTING/REFERRING PHYSICIAN: Cherylann Banas, MD  CHIEF COMPLAINT:   Chief Complaint  Patient presents with  . Shoulder Pain    HISTORY OF PRESENT ILLNESS:  Ronald Espinoza  is a 53 y.o. male who presents with chief complaint as above.  Patient presents the ED after an episode of left-sided upper chest pain, shoulder pain, radiating down his left arm.  Pain has since resolved.  Initial troponin in the ED was negative, however second troponin became positive.  Hospitalist called for admission  PAST MEDICAL HISTORY:   Past Medical History:  Diagnosis Date  . Diabetes mellitus without complication ([place])   . Hypertension      PAST SURGICAL HISTORY:   Past Surgical History:  Procedure Laterality Date  . COLONOSCOPY WITH PROPOFOL N/A 10/22/2016   Procedure: COLONOSCOPY WITH PROPOFOL;  Surgeon: Garlan Fair, MD;  Location: [facility];  Service: Endoscopy;  Laterality: N/A;     SOCIAL HISTORY:   Social History   Tobacco Use  . Smoking status: Former Smoker    Quit date: 10/23/2010    Years since quitting: 8.0  . Smokeless tobacco: Never Used  Substance Use Topics  . Alcohol use: No     FAMILY HISTORY:    Family history reviewed and is non-contributory DRUG ALLERGIES:   Allergies  Allergen Reactions  . Shellfish Allergy Shortness Of Breath and Itching    MEDICATIONS AT HOME:   Prior to Admission medications   Medication Sig Start Date End Date Taking? Authorizing Provider  albuterol (PROVENTIL HFA;VENTOLIN HFA) 108 (90 BASE) MCG/ACT inhaler Inhale 2 puffs into the lungs every 6 (six) hours as needed for wheezing.    [provider]  amLODipine-benazepril (LOTREL) 10-40 MG per capsule Take 1 capsule by mouth daily.    [provider]   EPINEPHrine (EPI-PEN) 0.3 mg/0.3 mL SOAJ injection Inject 0.3 mg into the muscle once.    [provider]  hydrochlorothiazide (HYDRODIURIL) 25 MG tablet Take 12.5 mg by mouth daily.    [provider]  ipratropium (ATROVENT) 0.06 % nasal spray Place 2 sprays into both nostrils 4 (four) times daily. 11/30/14   Waldemar Dickens, MD  metFORMIN (GLUCOPHAGE) 1000 MG tablet  01/02/18   [provider]  PREVIDENT 5000 SENSITIVE 1.1-5 % PSTE TK UTD 01/08/18   [provider]    REVIEW OF SYSTEMS:  Review of Systems  Constitutional: Negative for chills, fever, malaise/fatigue and weight loss.  HENT: Negative for ear pain, hearing loss and tinnitus.   Eyes: Negative for blurred vision, double vision, pain and redness.  Respiratory: Negative for cough, hemoptysis and shortness of breath.   Cardiovascular: Positive for chest pain. Negative for palpitations, orthopnea and leg swelling.  Gastrointestinal: Negative for abdominal pain, constipation, diarrhea, nausea and vomiting.  Genitourinary: Negative for dysuria, frequency and hematuria.  Musculoskeletal: Negative for back pain, joint pain and neck pain.  Skin:       No acne, rash, or lesions  Neurological: Negative for dizziness, tremors, focal weakness and weakness.  Endo/Heme/Allergies: Negative for polydipsia. Does not bruise/bleed easily.  Psychiatric/Behavioral: Negative for depression. The patient is not nervous/anxious and does not have insomnia.      VITAL SIGNS:   Vitals:   11/16/18 2130 11/16/18 2200 11/16/18  2230 11/16/18 2300  BP: 122/82 124/87 (!) 141/98 130/85  Pulse: 73 68 72 67  Resp:      Temp:      TempSrc:      SpO2: 99% 96% 99% 96%  Weight:      Height:       Wt Readings from Last 3 Encounters:  11/16/18 113.4 kg  10/22/16 131.5 kg    PHYSICAL EXAMINATION:  Physical Exam  Vitals reviewed. Constitutional: He is oriented to person, place, and time. He appears well-developed and  well-nourished. No distress.  HENT:  Head: Normocephalic and atraumatic.  Mouth/Throat: Oropharynx is clear and moist.  Eyes: Pupils are equal, round, and reactive to light. Conjunctivae and EOM are normal. No scleral icterus.  Neck: Normal range of motion. Neck supple. No JVD present. No thyromegaly present.  Cardiovascular: Normal rate, regular rhythm and intact distal pulses. Exam reveals no gallop and no friction rub.  No murmur heard. Respiratory: Effort normal and breath sounds normal. No respiratory distress. He has no wheezes. He has no rales.  GI: Soft. Bowel sounds are normal. He exhibits no distension. There is no abdominal tenderness.  Musculoskeletal: Normal range of motion.        General: No edema.     Comments: No arthritis, no gout  Lymphadenopathy:    He has no cervical adenopathy.  Neurological: He is alert and oriented to person, place, and time. No cranial nerve deficit.  No dysarthria, no aphasia  Skin: Skin is warm and dry. No rash noted. No erythema.  Psychiatric: He has a normal mood and affect. His behavior is normal. Judgment and thought content normal.    LABORATORY PANEL:   CBC Recent Labs  Lab 11/16/18 1916  WBC 10.2  HGB 14.5  HCT 43.0  PLT 193   ------------------------------------------------------------------------------------------------------------------  Chemistries  Recent Labs  Lab 11/16/18 1916  NA 140  K 3.5  CL 106  CO2 26  GLUCOSE 137*  BUN 25*  CREATININE 1.35*  CALCIUM 9.0   ------------------------------------------------------------------------------------------------------------------  Cardiac Enzymes No results for input(s): TROPONINI in the last 168 hours. ------------------------------------------------------------------------------------------------------------------  RADIOLOGY:  Dg Chest 2 View  Result Date: 11/16/2018 CLINICAL DATA:  Chest, left shoulder and arm pain. EXAM: CHEST - 2 VIEW COMPARISON:   05/22/2016 and 02/07/2013 FINDINGS: The heart size and pulmonary vascularity are normal. Tiny area of atelectasis at the left lung base laterally. Old healed left lateral rib fractures. No acute bone abnormality. No effusions. IMPRESSION: No significant abnormalities. Electronically Signed   By: Lorriane Shire M.D.   On: 11/16/2018 20:30    EKG:   Orders placed or performed during the hospital encounter of 11/16/18  . EKG 12-Lead  . EKG 12-Lead  . ED EKG  . ED EKG    IMPRESSION AND PLAN:  Principal Problem:   Chest pain -symptoms have resolved.  Second troponin in the ED became positive.  Cycle cardiac enzymes tonight, get an echocardiogram and cardiology consult. Active Problems:   HTN (hypertension) -home dose antihypertensives   Diabetes ([place]) -sliding scale insulin  Chart review performed and case discussed with ED provider. Labs, imaging and/or ECG reviewed by provider and discussed with patient/family. Management plans discussed with the patient and/or family.  COVID-19 status: Test pending  DVT PROPHYLAXIS: SubQ lovenox   GI PROPHYLAXIS:  None  ADMISSION STATUS: Observation  CODE STATUS: Full  TOTAL TIME TAKING CARE OF THIS PATIENT: 40 minutes.   This patient was evaluated in the context of the  global COVID-19 pandemic, which necessitated consideration that the patient might be at risk for infection with the SARS-CoV-2 virus that causes COVID-19. Institutional protocols and algorithms that pertain to the evaluation of patients at risk for COVID-19 are in a state of rapid change based on information released by regulatory bodies including the CDC and federal and state organizations. These policies and algorithms were followed to the best of this provider's knowledge to date during the patient's care at this facility.  Ethlyn Daniels 11/16/2018, 11:36 PM  [facility]  Office  [phone]  CC: Primary care physician; Wenda Low, MD  Note:  This  document was prepared using Dragon voice recognition software and may include unintentional dictation errors.

## 2018-11-16 NOTE — ED Notes (Addendum)
ED TO INPATIENT HANDOFF REPORT  ED Nurse Name and Phone #: Karena Addison [phone] Name/Age/Gender Graylon Good 53 y.o. male Room/Bed: ED16A/ED16A  Code Status   Code Status: Not on file  Home/SNF/Other Home Patient oriented to: self, place, time and situation Is this baseline? Yes   Triage Complete: Triage complete  Chief Complaint Shoulder/Arm Pain  Triage Note Pt arrives to ED via POV from home with c/o left shoulder pain with radiation down the left arm x1 hr that started when he "received a phone call from work" that might "have stressed me out". Pt denies CP, no SHOB. Pt denies N/V/D; no diaphoresis. Pt denies cardiac h/x. Pt is A&O, in NAD; RR even, regular and unlabored.   Allergies Allergies  Allergen Reactions  . Shellfish Allergy Shortness Of Breath and Itching    Level of Care/Admitting Diagnosis ED Disposition    ED Disposition Condition Comment   Admit  The patient appears reasonably stabilized for admission considering the current resources, flow, and capabilities available in the ED at this time, and I doubt any other [facility] requiring further screening and/or treatment in the ED prior to admission is  present.       B Medical/Surgery History Past Medical History:  Diagnosis Date  . Diabetes mellitus without complication ([place])   . Hypertension    Past Surgical History:  Procedure Laterality Date  . COLONOSCOPY WITH PROPOFOL N/A 10/22/2016   Procedure: COLONOSCOPY WITH PROPOFOL;  Surgeon: Garlan Fair, MD;  Location: [facility];  Service: Endoscopy;  Laterality: N/A;     A IV Location/Drains/Wounds Patient Lines/Drains/Airways Status   Active Line/Drains/Airways    Name:   Placement date:   Placement time:   Site:   Days:   Airway   10/22/16    0922     755          Intake/Output Last 24 hours No intake or output data in the 24 hours ending 11/16/18 2235  Labs/Imaging Results for orders placed or performed during the hospital encounter of 11/16/18  (from the past 48 hour(s))  Basic metabolic panel     Status: Abnormal   Collection Time: 11/16/18  7:16 PM  Result Value Ref Range   Sodium 140 135 - 145 mmol/L   Potassium 3.5 3.5 - 5.1 mmol/L   Chloride 106 98 - 111 mmol/L   CO2 26 22 - 32 mmol/L   Glucose, Bld 137 (H) 70 - 99 mg/dL   BUN 25 (H) 6 - 20 mg/dL   Creatinine, Ser 1.35 (H) 0.61 - 1.24 mg/dL   Calcium 9.0 8.9 - 10.3 mg/dL   GFR calc non Af Amer 60 (L) >60 mL/min   GFR calc Af Amer >60 >60 mL/min   Anion gap 8 5 - 15    Comment: Performed at [facility], [address] [place], [place] [address]  CBC     Status: None   Collection Time: 11/16/18  7:16 PM  Result Value Ref Range   WBC 10.2 4.0 - 10.5 K/uL   RBC 4.69 4.22 - 5.81 MIL/uL   Hemoglobin 14.5 13.0 - 17.0 g/dL   HCT 43.0 39.0 - 52.0 %   MCV 91.7 80.0 - 100.0 fL   MCH 30.9 26.0 - 34.0 pg   MCHC 33.7 30.0 - 36.0 g/dL   RDW 14.4 11.5 - 15.5 %   Platelets 193 150 - 400 K/uL   nRBC 0.0 0.0 - 0.2 %    Comment: Performed  at [facility], [place], [place] [address]  Troponin I (High Sensitivity)     Status: None   Collection Time: 11/16/18  7:16 PM  Result Value Ref Range   Troponin I (High Sensitivity) 7 <18 ng/L    Comment: (NOTE) Elevated high sensitivity troponin I (hsTnI) values and significant  changes across serial measurements may suggest ACS but many other  chronic and acute conditions are known to elevate hsTnI results.  Refer to the "Links" section for chest pain algorithms and additional  guidance. Performed at [facility], [place], [place] [address]   Troponin I (High Sensitivity)     Status: Abnormal   Collection Time: 11/16/18  9:48 PM  Result Value Ref Range   Troponin I (High Sensitivity) 50 (H) <18 ng/L    Comment: (NOTE) Elevated high sensitivity troponin I (hsTnI) values and significant  changes across serial measurements may suggest ACS but many other  chronic and acute  conditions are known to elevate hsTnI results.  Refer to the "Links" section for chest pain algorithms and additional  guidance. Performed at [facility], [address] [place], [place] [address]    Dg Chest 2 View  Result Date: 11/16/2018 CLINICAL DATA:  Chest, left shoulder and arm pain. EXAM: CHEST - 2 VIEW COMPARISON:  05/22/2016 and 02/07/2013 FINDINGS: The heart size and pulmonary vascularity are normal. Tiny area of atelectasis at the left lung base laterally. Old healed left lateral rib fractures. No acute bone abnormality. No effusions. IMPRESSION: No significant abnormalities. Electronically Signed   By: Lorriane Shire M.D.   On: 11/16/2018 20:30    Pending Labs Unresulted Labs (From admission, onward)   None      Vitals/Pain Today's Vitals   11/16/18 2100 11/16/18 2130 11/16/18 2200 11/16/18 2230  BP: 121/75 122/82 124/87 (!) 141/98  Pulse: 71 73 68 72  Resp:      Temp:      TempSrc:      SpO2: 98% 99% 96% 99%  Weight:      Height:      PainSc:        Isolation Precautions No active isolations  Medications Medications  aspirin chewable tablet 324 mg (has no administration in time range)    Mobility walks Low fall risk   Focused Assessments    R Recommendations: See Admitting Provider Note  Report given to: Gertie Exon, RN

## 2018-11-16 NOTE — ED Provider Notes (Signed)
[facility] Emergency Department Provider Note ____________________________________________   First MD Initiated Contact with Patient 11/16/18 2021     (approximate)  I have reviewed the triage vital signs and the nursing notes.   HISTORY  Chief Complaint Shoulder Pain    HPI Ronald Espinoza is a 53 y.o. male with PMH as noted below who presents with left shoulder and upper chest pain, acute onset this evening approximately 2 hours prior to arrival after he was involved in a discussion which caused him some stress.  The patient denies associated nausea, shortness of breath, or lightheadedness.  He denies any prior history of the pain.  He states it does not really change with position or movement.  Past Medical History:  Diagnosis Date   Diabetes mellitus without complication ([place])    Hypertension     There are no active problems to display for this patient.   Past Surgical History:  Procedure Laterality Date   COLONOSCOPY WITH PROPOFOL N/A 10/22/2016   Procedure: COLONOSCOPY WITH PROPOFOL;  Surgeon: Garlan Fair, MD;  Location: [facility] ENDOSCOPY;  Service: Endoscopy;  Laterality: N/A;    Prior to Admission medications   Medication Sig Start Date End Date Taking? Authorizing Provider  albuterol (PROVENTIL HFA;VENTOLIN HFA) 108 (90 BASE) MCG/ACT inhaler Inhale 2 puffs into the lungs every 6 (six) hours as needed for wheezing.    [provider]  amLODipine-benazepril (LOTREL) 10-40 MG per capsule Take 1 capsule by mouth daily.    [provider]  EPINEPHrine (EPI-PEN) 0.3 mg/0.3 mL SOAJ injection Inject 0.3 mg into the muscle once.    [provider]  hydrochlorothiazide (HYDRODIURIL) 25 MG tablet Take 12.5 mg by mouth daily.    [provider]  ipratropium (ATROVENT) 0.06 % nasal spray Place 2 sprays into both nostrils 4 (four) times daily. 11/30/14   Waldemar Dickens, MD  metFORMIN (GLUCOPHAGE) 1000 MG tablet   01/02/18   [provider]  PREVIDENT 5000 SENSITIVE 1.1-5 % PSTE TK UTD 01/08/18   [provider]    Allergies Shellfish allergy  No family history on file.  Social History Social History   Tobacco Use   Smoking status: Former Smoker    Quit date: 10/23/2010    Years since quitting: 8.0   Smokeless tobacco: Never Used  Substance Use Topics   Alcohol use: No   Drug use: No    Review of Systems  Constitutional: No fever/chills. Eyes: No redness. ENT: No neck pain. Cardiovascular: Positive for chest pain. Respiratory: Denies shortness of breath. Gastrointestinal: No nausea or vomiting. Genitourinary: Negative for flank pain.  Musculoskeletal: Negative for back pain. Skin: Negative for rash. Neurological: Negative for headache.   ____________________________________________   PHYSICAL EXAM:  VITAL SIGNS: ED Triage Vitals  Enc Vitals Group     BP 11/16/18 1914 136/84     Pulse Rate 11/16/18 1914 82     Resp 11/16/18 1914 16     Temp 11/16/18 1914 99.1 F (37.3 C)     Temp Source 11/16/18 1914 Oral     SpO2 11/16/18 1914 97 %     Weight 11/16/18 1904 250 lb (113.4 kg)     Height 11/16/18 1904 5\' 11"  (1.803 m)     Head Circumference --      Peak Flow --      Pain Score 11/16/18 1904 4     Pain Loc --      Pain Edu? --  Excl. in [place]? --     Constitutional: Alert and oriented. Well appearing and in no acute distress. Eyes: Conjunctivae are normal.  Head: Atraumatic. Nose: No congestion/rhinnorhea. Mouth/Throat: Mucous membranes are moist.   Neck: Normal range of motion.  Cardiovascular: Normal rate, regular rhythm. Espinoza peripheral circulation. Respiratory: Normal respiratory effort.  No retractions.  Gastrointestinal: No distention.  Musculoskeletal: No lower extremity edema.  Extremities warm and well perfused.  No anterior chest wall tenderness. Neurologic:  Normal speech and language. No gross focal neurologic deficits are  appreciated.  Skin:  Skin is warm and dry. No rash noted. Psychiatric: Mood and affect are normal. Speech and behavior are normal.  ____________________________________________   LABS (all labs ordered are listed, but only abnormal results are displayed)  Labs Reviewed  BASIC METABOLIC PANEL - Abnormal; Notable for the following components:      Result Value   Glucose, Bld 137 (*)    BUN 25 (*)    Creatinine, Ser 1.35 (*)    GFR calc non Af Amer 60 (*)    All other components within normal limits  TROPONIN I (HIGH SENSITIVITY) - Abnormal; Notable for the following components:   Troponin I (High Sensitivity) 50 (*)    All other components within normal limits  NOVEL CORONAVIRUS, NAA (HOSPITAL ORDER, SEND-OUT TO REF LAB)  CBC  TROPONIN I (HIGH SENSITIVITY)   ____________________________________________  EKG  ED ECG REPORT I, Arta Silence, the attending physician, personally viewed and interpreted this ECG.  Date: 11/16/2018 EKG Time: 1907 Rate: 72 Rhythm: normal sinus rhythm QRS Axis: normal Intervals: normal ST/T Wave abnormalities: normal Narrative Interpretation: no evidence of acute ischemia  ____________________________________________  RADIOLOGY  CXR: No acute abnormality  ____________________________________________   PROCEDURES  Procedure(s) performed: No  Procedures  Critical Care performed: No ____________________________________________   INITIAL IMPRESSION / ASSESSMENT AND PLAN / ED COURSE  Pertinent labs & imaging results that were available during my care of the patient were reviewed by me and considered in my medical decision making (see chart for details).  53 year old male with PMH as noted above presents with atypical left-sided chest pain which started after he was involved in a stressful conversation.  It is not associated with shortness of breath, lightheadedness, or nausea, and it is now subsiding.  He states that started  around 5:30 PM.  On exam, the patient is very well-appearing.  His vital signs are normal.  The physical exam is unremarkable.  EKG is nonischemic.  Overall based on the location and quality the pain and the lack of associated symptoms or EKG abnormalities, my suspicion for ACS is very low.  I suspect most likely musculoskeletal or other benign etiology.  We will obtain high-sensitivity troponin x2, chest x-ray, and reassess.  ----------------------------------------- 10:42 PM on 11/16/2018 -----------------------------------------  Repeat troponin is slightly elevated, so per the ED chest pain algorithm I will admit the patient for observation and ACS rule out.  I signed him out to the hospitalist Dr. Jannifer Franklin.  Aspirin has been given.  __________________________  Ronald Espinoza was evaluated in Emergency Department on 11/16/2018 for the symptoms described in the history of present illness. He was evaluated in the context of the global COVID-19 pandemic, which necessitated consideration that the patient might be at risk for infection with the SARS-CoV-2 virus that causes COVID-19. Institutional protocols and algorithms that pertain to the evaluation of patients at risk for COVID-19 are in a state of rapid change based on information released by regulatory  bodies including the CDC and federal and state organizations. These policies and algorithms were followed during the patient's care in the ED. ____________________________________________   FINAL CLINICAL IMPRESSION(S) / ED DIAGNOSES  Final diagnoses:  Atypical chest pain  Elevated troponin      NEW MEDICATIONS STARTED DURING THIS VISIT:  New Prescriptions   No medications on file     Note:  This document was prepared using Dragon voice recognition software and may include unintentional dictation errors.    Arta Silence, MD 11/16/18 2242

## 2018-11-17 ENCOUNTER — Observation Stay
Admit: 2018-11-17 | Discharge: 2018-11-17 | Disposition: A | Discharge status: Home / Self Care | Payer: BC Managed Care – PPO | Attending: Internal Medicine | Admitting: Internal Medicine

## 2018-11-17 LAB — BASIC METABOLIC PANEL
Anion gap: 4 — ABNORMAL LOW (ref 5–15)
BUN: 21 mg/dL — ABNORMAL HIGH (ref 6–20)
CO2: 26 mmol/L (ref 22–32)
Calcium: 8.6 mg/dL — ABNORMAL LOW (ref 8.9–10.3)
Chloride: 110 mmol/L (ref 98–111)
Creatinine, Ser: 1.05 mg/dL (ref 0.61–1.24)
GFR calc Af Amer: >60 mL/min (ref >60)
GFR calc non Af Amer: >60 mL/min (ref >60)
Glucose, Bld: 138 mg/dL — ABNORMAL HIGH (ref 70–99)
Potassium: 3.6 mmol/L (ref 3.5–5.1)
Sodium: 140 mmol/L (ref 135–145)

## 2018-11-17 LAB — GLUCOSE, CAPILLARY
Glucose-Capillary: 96 mg/dL (ref 70–99)
Glucose-Capillary: 120 mg/dL — ABNORMAL HIGH (ref 70–99)
Glucose-Capillary: 88 mg/dL (ref 70–99)
Glucose-Capillary: 132 mg/dL — ABNORMAL HIGH (ref 70–99)

## 2018-11-17 LAB — APTT: aPTT: 31 seconds (ref 24–36)

## 2018-11-17 LAB — LIPID PANEL
Cholesterol: 173 mg/dL (ref 0–200)
HDL: 45 mg/dL (ref >40)
LDL Cholesterol: 121 mg/dL — ABNORMAL HIGH (ref 0–99)
Total CHOL/HDL Ratio: 3.8 RATIO
Triglycerides: 37 mg/dL (ref <150)
VLDL: 7 mg/dL (ref 0–40)

## 2018-11-17 LAB — CBC
HCT: 41 % (ref 39.0–52.0)
Hemoglobin: 13.7 g/dL (ref 13.0–17.0)
MCH: 30.6 pg (ref 26.0–34.0)
MCHC: 33.4 g/dL (ref 30.0–36.0)
MCV: 91.5 fL (ref 80.0–100.0)
Platelets: 179 10*3/uL (ref 150–400)
RBC: 4.48 MIL/uL (ref 4.22–5.81)
RDW: 14.3 % (ref 11.5–15.5)
WBC: 7.8 10*3/uL (ref 4.0–10.5)
nRBC: 0 % (ref 0.0–0.2)

## 2018-11-17 LAB — TROPONIN I (HIGH SENSITIVITY)
Troponin I (High Sensitivity): 50 ng/L — ABNORMAL HIGH (ref <18)
Troponin I (High Sensitivity): 269 ng/L (ref <18)
Troponin I (High Sensitivity): 353 ng/L (ref <18)

## 2018-11-17 LAB — PROTIME-INR
INR: 1 (ref 0.8–1.2)
Prothrombin Time: 12.7 seconds (ref 11.4–15.2)

## 2018-11-17 LAB — HEPARIN LEVEL (UNFRACTIONATED)
Heparin Unfractionated: 0.58 IU/mL (ref 0.30–0.70)
Heparin Unfractionated: 0.4 IU/mL (ref 0.30–0.70)
Heparin Unfractionated: 0.44 IU/mL (ref 0.30–0.70)

## 2018-11-17 MED ORDER — SODIUM CHLORIDE 0.9 % WEIGHT BASED INFUSION
3.0000 mL/kg/h | INTRAVENOUS | Status: AC
  Administered 2018-11-18: 3 mL/kg/h via INTRAVENOUS

## 2018-11-17 MED ORDER — ACETAMINOPHEN 650 MG RE SUPP: 650.0000 mg | Freq: Four times a day (QID) | RECTAL | Status: DC | PRN

## 2018-11-17 MED ORDER — HEPARIN BOLUS VIA INFUSION
4000.0000 [IU] | Freq: Once | INTRAVENOUS | Status: DC
  Filled 2018-11-17: qty 4000

## 2018-11-17 MED ORDER — SIMVASTATIN 20 MG PO TABS: 10.0000 mg | ORAL_TABLET | ORAL | Status: DC

## 2018-11-17 MED ORDER — ACETAMINOPHEN 325 MG PO TABS
650.0000 mg | ORAL_TABLET | Freq: Four times a day (QID) | ORAL | Status: DC | PRN
  Filled 2018-11-17: qty 2

## 2018-11-17 MED ORDER — SODIUM CHLORIDE 0.9 % IV SOLN: 250.0000 mL | INTRAVENOUS | Status: DC | PRN

## 2018-11-17 MED ORDER — ENOXAPARIN SODIUM 40 MG/0.4ML ~~LOC~~ SOLN
40.0000 mg | SUBCUTANEOUS | Status: DC
  Administered 2018-11-17: 40 mg via SUBCUTANEOUS
  Filled 2018-11-17: qty 0.4

## 2018-11-17 MED ORDER — ONDANSETRON HCL 4 MG PO TABS: 4.0000 mg | ORAL_TABLET | Freq: Four times a day (QID) | ORAL | Status: DC | PRN

## 2018-11-17 MED ORDER — METOPROLOL SUCCINATE ER 25 MG PO TB24
25.0000 mg | ORAL_TABLET | Freq: Every day | ORAL | Status: DC
  Administered 2018-11-17 – 2018-11-18 (×2): 25 mg via ORAL
  Filled 2018-11-17 (×2): qty 1

## 2018-11-17 MED ORDER — SODIUM CHLORIDE 0.9 % WEIGHT BASED INFUSION: 1.0000 mL/kg/h | INTRAVENOUS | Status: DC

## 2018-11-17 MED ORDER — HEPARIN (PORCINE) 25000 UT/250ML-% IV SOLN
1400.0000 [IU]/h | INTRAVENOUS | Status: DC
  Administered 2018-11-17 (×2): 1400 [IU]/h via INTRAVENOUS
  Filled 2018-11-17 (×2): qty 250

## 2018-11-17 MED ORDER — ONDANSETRON HCL 4 MG/2ML IJ SOLN: 4.0000 mg | Freq: Four times a day (QID) | INTRAMUSCULAR | Status: DC | PRN

## 2018-11-17 MED ORDER — AMLODIPINE BESYLATE 10 MG PO TABS
10.0000 mg | ORAL_TABLET | Freq: Every day | ORAL | Status: DC
  Administered 2018-11-17 – 2018-11-18 (×2): 10 mg via ORAL
  Filled 2018-11-17 (×2): qty 1

## 2018-11-17 MED ORDER — SODIUM CHLORIDE 0.9% FLUSH: 3.0000 mL | Freq: Two times a day (BID) | INTRAVENOUS | Status: DC

## 2018-11-17 MED ORDER — ASPIRIN 81 MG PO CHEW
81.0000 mg | CHEWABLE_TABLET | ORAL | Status: AC
  Administered 2018-11-18: 81 mg via ORAL
  Filled 2018-11-17: qty 1

## 2018-11-17 MED ORDER — SODIUM CHLORIDE 0.9% FLUSH: 3.0000 mL | INTRAVENOUS | Status: DC | PRN

## 2018-11-17 MED ORDER — BENAZEPRIL HCL 20 MG PO TABS
40.0000 mg | ORAL_TABLET | Freq: Every day | ORAL | Status: DC
  Administered 2018-11-17 – 2018-11-18 (×2): 40 mg via ORAL
  Filled 2018-11-17 (×2): qty 2

## 2018-11-17 MED ORDER — HEPARIN BOLUS VIA INFUSION
2000.0000 [IU] | Freq: Once | INTRAVENOUS | Status: AC
  Administered 2018-11-17: 2000 [IU] via INTRAVENOUS
  Filled 2018-11-17: qty 2000

## 2018-11-17 MED ORDER — ALBUTEROL SULFATE (2.5 MG/3ML) 0.083% IN NEBU: 2.5000 mg | INHALATION_SOLUTION | Freq: Four times a day (QID) | RESPIRATORY_TRACT | Status: DC | PRN

## 2018-11-17 MED ORDER — AMLODIPINE BESY-BENAZEPRIL HCL 10-40 MG PO CAPS: 1.0000 | ORAL_CAPSULE | Freq: Every day | ORAL | Status: DC

## 2018-11-17 MED ORDER — HYDROCHLOROTHIAZIDE 25 MG PO TABS
12.5000 mg | ORAL_TABLET | Freq: Every day | ORAL | Status: DC
  Administered 2018-11-17 – 2018-11-18 (×2): 12.5 mg via ORAL
  Filled 2018-11-17 (×2): qty 1

## 2018-11-17 MED ORDER — IPRATROPIUM BROMIDE 0.06 % NA SOLN
2.0000 | Freq: Four times a day (QID) | NASAL | Status: DC
  Administered 2018-11-18: 2 via NASAL
  Filled 2018-11-17: qty 15

## 2018-11-17 MED ORDER — INSULIN ASPART 100 UNIT/ML ~~LOC~~ SOLN
0.0000 [IU] | Freq: Three times a day (TID) | SUBCUTANEOUS | Status: DC
  Administered 2018-11-18: 12:00:00 1 [IU] via SUBCUTANEOUS
  Filled 2018-11-17: qty 1

## 2018-11-17 NOTE — Progress Notes (Signed)
Patient was admitted following c/o left shoulder pain. On admission patient was A&O X4, ambulatory, denied been in any pain or distress. Patient admission as completed with patient. Care plan was reviewed with patient. Per order patient was kept NPO overnight. Patient remained NSR with stable VS.

## 2018-11-17 NOTE — Progress Notes (Signed)
ANTICOAGULATION CONSULT NOTE - Initial Consult  Pharmacy Consult for heparin Indication: chest pain/ACS  Allergies  Allergen Reactions  . Shellfish Allergy Shortness Of Breath and Itching    Patient Measurements: Height: 5\' 11"  (180.3 cm) Weight: 250 lb (113.4 kg) IBW/kg (Calculated) : 75.3 Heparin Dosing Weight: 99 kg  Vital Signs: Temp: 97.9 F (36.6 C) (06/30 1604) Temp Source: Oral (06/30 1604) BP: 133/90 (06/30 1604) Pulse Rate: 66 (06/30 1604)  Labs: Recent Labs    11/16/18 1916 11/16/18 2148 11/17/18 0106 11/17/18 0235 11/17/18 0342 11/17/18 0934 11/17/18 1511  HGB 14.5  --   --  13.7  --   --   --   HCT 43.0  --   --  41.0  --   --   --   PLT 193  --   --  179  --   --   --   APTT  --   --   --   --  31  --   --   LABPROT  --   --   --   --  12.7  --   --   INR  --   --   --   --  1.0  --   --   HEPARINUNFRC  --   --   --   --   --  0.58 0.40  CREATININE 1.35*  --   --  1.05  --   --   --   TROPONINIHS 7 50* 269* 353*  --   --   --     Estimated Creatinine Clearance: 105.3 mL/min (by C-G formula based on SCr of 1.05 mg/dL).   Medical History: Past Medical History:  Diagnosis Date  . Diabetes mellitus without complication ([place])   . Hypertension     Medications:  Scheduled:  . amLODipine  10 mg Oral Daily   And  . benazepril  40 mg Oral Daily  . [START ON 11/18/2018] aspirin  81 mg Oral Pre-Cath  . hydrochlorothiazide  12.5 mg Oral Daily  . insulin aspart  0-9 Units Subcutaneous TID WC  . ipratropium  2 spray Each Nare QID  . metoprolol succinate  25 mg Oral Daily  . [START ON 11/19/2018] simvastatin  10 mg Oral 2 times weekly  . sodium chloride flush  3 mL Intravenous Q12H    Assessment: Patient admitted for shoulder pain w/ reports of LS upper CP radiating down the L arm. Patient has a h/o DM w/o complications, and HTN. EKG showing some inverted T-waves and ST elevation that is no longer elevated in anterior leads, NSR. Initial trop was 50 >>  269 >> 353. Patient is being started in heparin drip for NSTEMI.  Heparin bolus 2000 units IV x 1 followed by 1400 units/hr infusion 6/30 @0934  HL 0.58.  6/30 @1511  HL 0.40.  Goal of Therapy:  Heparin level 0.3-0.7 units/ml Monitor platelets by anticoagulation protocol: Yes   Plan:  Heparin level is therapeutic.  Will continue rate at 1400 units/hr. Will check HL @ 2100. Will monitor daily CBC's and adjust per HL's. Plan for [facility] 7/1.   Eleonore Chiquito, PharmD, BCPS Clinical Pharmacist 11/17/2018,4:04 PM

## 2018-11-17 NOTE — Plan of Care (Signed)
Problem: Education: Goal: Knowledge of General Education information will improve Description: Including pain rating scale, medication(s)/side effects and non-pharmacologic comfort measures Outcome: Progressing   Problem: Health Behavior/Discharge Planning: Goal: Ability to manage health-related needs will improve Outcome: Progressing   Problem: Clinical Measurements: Goal: Ability to maintain clinical measurements within normal limits will improve Outcome: Progressing Goal: Will remain free from infection Outcome: Progressing Goal: Diagnostic test results will improve Outcome: Progressing Goal: Respiratory complications will improve Outcome: Progressing Goal: Cardiovascular complication will be avoided Outcome: Progressing   Problem: Activity: Goal: Risk for activity intolerance will decrease Outcome: Progressing   Problem: Nutrition: Goal: Adequate nutrition will be maintained Outcome: Progressing   Problem: Coping: Goal: Level of anxiety will decrease Outcome: Progressing   Problem: Elimination: Goal: Will not experience complications related to bowel motility Outcome: Progressing Goal: Will not experience complications related to urinary retention Outcome: Progressing   Problem: Pain Managment: Goal: General experience of comfort will improve Outcome: Progressing   Problem: Safety: Goal: Ability to remain free from injury will improve Outcome: Progressing   Problem: Skin Integrity: Goal: Risk for impaired skin integrity will decrease Outcome: Progressing   Problem: Education: Goal: Understanding of cardiac disease, CV risk reduction, and recovery process will improve Outcome: Progressing Goal: Individualized Educational Video(s) Outcome: Progressing   Problem: Activity: Goal: Ability to tolerate increased activity will improve Outcome: Progressing   Problem: Cardiac: Goal: Ability to achieve and maintain adequate cardiovascular perfusion will  improve Outcome: Progressing   Problem: Health Behavior/Discharge Planning: Goal: Ability to safely manage health-related needs after discharge will improve Outcome: Progressing   Problem: Education: Goal: Understanding of CV disease, CV risk reduction, and recovery process will improve Outcome: Progressing Goal: Individualized Educational Video(s) Outcome: Progressing   Problem: Activity: Goal: Ability to return to baseline activity level will improve Outcome: Progressing   Problem: Cardiovascular: Goal: Ability to achieve and maintain adequate cardiovascular perfusion will improve Outcome: Progressing Goal: Vascular access site(s) Level 0-1 will be maintained Outcome: Progressing   Problem: Health Behavior/Discharge Planning: Goal: Ability to safely manage health-related needs after discharge will improve Outcome: Progressing   Problem: Education: Goal: Knowledge of General Education information will improve Description: Including pain rating scale, medication(s)/side effects and non-pharmacologic comfort measures Outcome: Progressing   Problem: Health Behavior/Discharge Planning: Goal: Ability to manage health-related needs will improve Outcome: Progressing   Problem: Clinical Measurements: Goal: Ability to maintain clinical measurements within normal limits will improve Outcome: Progressing Goal: Will remain free from infection Outcome: Progressing Goal: Diagnostic test results will improve Outcome: Progressing Goal: Respiratory complications will improve Outcome: Progressing Goal: Cardiovascular complication will be avoided Outcome: Progressing   Problem: Activity: Goal: Risk for activity intolerance will decrease Outcome: Progressing   Problem: Nutrition: Goal: Adequate nutrition will be maintained Outcome: Progressing   Problem: Coping: Goal: Level of anxiety will decrease Outcome: Progressing   Problem: Elimination: Goal: Will not experience  complications related to bowel motility Outcome: Progressing Goal: Will not experience complications related to urinary retention Outcome: Progressing   Problem: Pain Managment: Goal: General experience of comfort will improve Outcome: Progressing   Problem: Safety: Goal: Ability to remain free from injury will improve Outcome: Progressing   Problem: Skin Integrity: Goal: Risk for impaired skin integrity will decrease Outcome: Progressing   Problem: Education: Goal: Understanding of cardiac disease, CV risk reduction, and recovery process will improve Outcome: Progressing Goal: Individualized Educational Video(s) Outcome: Progressing   Problem: Activity: Goal: Ability to tolerate increased activity will improve Outcome: Progressing   Problem: Cardiac:  Goal: Ability to achieve and maintain adequate cardiovascular perfusion will improve Outcome: Progressing   Problem: Health Behavior/Discharge Planning: Goal: Ability to safely manage health-related needs after discharge will improve Outcome: Progressing   Problem: Education: Goal: Understanding of CV disease, CV risk reduction, and recovery process will improve Outcome: Progressing Goal: Individualized Educational Video(s) Outcome: Progressing   Problem: Activity: Goal: Ability to return to baseline activity level will improve Outcome: Progressing   Problem: Cardiovascular: Goal: Ability to achieve and maintain adequate cardiovascular perfusion will improve Outcome: Progressing Goal: Vascular access site(s) Level 0-1 will be maintained Outcome: Progressing   Problem: Health Behavior/Discharge Planning: Goal: Ability to safely manage health-related needs after discharge will improve Outcome: Progressing

## 2018-11-17 NOTE — Progress Notes (Signed)
[place] [facility] [place] NAME: Ronald Espinoza    MR#:  [id number]  DATE OF BIRTH:  Sep 05, 1965  SUBJECTIVE:  CHIEF COMPLAINT:   Chief Complaint  Patient presents with  . Shoulder Pain   No new complaint this morning.  Currently chest pain-free.  Scheduled for cardiac catheterization tomorrow. REVIEW OF SYSTEMS:  Review of Systems  Constitutional: Negative for chills and fever.  HENT: Negative for hearing loss and tinnitus.   Eyes: Negative for blurred vision and double vision.  Respiratory: Negative for cough and shortness of breath.   Cardiovascular: Negative for palpitations.       Chest pain resolved  Gastrointestinal: Negative for heartburn, nausea and vomiting.  Genitourinary: Negative for dysuria and urgency.  Musculoskeletal: Negative for myalgias and neck pain.  Skin: Negative for itching and rash.  Neurological: Negative for dizziness and headaches.  Psychiatric/Behavioral: Negative for depression and hallucinations.    DRUG ALLERGIES:   Allergies  Allergen Reactions  . Shellfish Allergy Shortness Of Breath and Itching   VITALS:  Blood pressure 127/89, pulse 63, temperature 98.1 F (36.7 C), temperature source Oral, resp. rate 16, height 5\' 11"  (1.803 m), weight 113.4 kg, SpO2 99 %. PHYSICAL EXAMINATION:    Physical Exam  Constitutional: He is oriented to person, place, and time. He appears well-developed.  HENT:  Head: Normocephalic and atraumatic.  Right Ear: External ear normal.  Eyes: Pupils are equal, round, and reactive to light. Conjunctivae are normal. Right eye exhibits no discharge.  Neck: Normal range of motion. No thyromegaly present.  Cardiovascular: Normal rate, regular rhythm and normal heart sounds.  Respiratory: Effort normal. No respiratory distress.  GI: Soft. Bowel sounds are normal. There is no abdominal tenderness.  Musculoskeletal: Normal range of motion.        General: No edema.  Neurological:  He is alert and oriented to person, place, and time. No cranial nerve deficit.  Skin: Skin is warm. He is not diaphoretic. No erythema.  Psychiatric: He has a normal mood and affect. His behavior is normal.     LABORATORY PANEL:  Male CBC Recent Labs  Lab 11/17/18 0235  WBC 7.8  HGB 13.7  HCT 41.0  PLT 179   ------------------------------------------------------------------------------------------------------------------ Chemistries  Recent Labs  Lab 11/17/18 0235  NA 140  K 3.6  CL 110  CO2 26  GLUCOSE 138*  BUN 21*  CREATININE 1.05  CALCIUM 8.6*   RADIOLOGY:  Dg Chest 2 View  Result Date: 11/16/2018 CLINICAL DATA:  Chest, left shoulder and arm pain. EXAM: CHEST - 2 VIEW COMPARISON:  05/22/2016 and 02/07/2013 FINDINGS: The heart size and pulmonary vascularity are normal. Tiny area of atelectasis at the left lung base laterally. Old healed left lateral rib fractures. No acute bone abnormality. No effusions. IMPRESSION: No significant abnormalities. Electronically Signed   By: Lorriane Shire M.D.   On: 11/16/2018 20:30   ASSESSMENT AND PLAN:   1.Chest pain -symptoms have resolved. Patient with borderline elevated troponin with normal EKG.  Seen by cardiologist.  Plans for cardiac catheterization tomorrow. Follow-up on 2D echocardiogram when done after COVID test is negative. Patient on heparin drip.  Lipid panel done with LDL of 121.  2.   HTN (hypertension) -home dose antihypertensives Blood pressure controlled on current regimen.  3.  Diabetes ([place]) -sliding scale insulin Metformin on hold  DVT prophylaxis; patient already on heparin drip   All the records are reviewed and case discussed with Care Management/Social  Worker. Management plans discussed with the patient, family and they are in agreement.  CODE STATUS: Full Code  TOTAL TIME TAKING CARE OF THIS PATIENT: 34 minutes.   More than 50% of the time was spent in counseling/coordination of care: YES   POSSIBLE D/C IN 2 DAYS, DEPENDING ON CLINICAL CONDITION.   Payson Crumby M.D on 11/17/2018 at 1:19 PM  Between 7am to 6pm - Pager - [phone]  After 6pm go to [email or web address] - Proofreader  [facility]  Office  [phone]  CC: Primary care physician; Wenda Low, MD  Note: This dictation was prepared with Dragon dictation along with smaller phrase technology. Any transcriptional errors that result from this process are unintentional.

## 2018-11-17 NOTE — Plan of Care (Signed)
  Problem: Education: Goal: Knowledge of General Education information will improve Description: Including pain rating scale, medication(s)/side effects and non-pharmacologic comfort measures Outcome: Progressing   Problem: Health Behavior/Discharge Planning: Goal: Ability to manage health-related needs will improve Outcome: Progressing   Problem: Clinical Measurements: Goal: Ability to maintain clinical measurements within normal limits will improve Outcome: Progressing Goal: Will remain free from infection Outcome: Progressing Goal: Diagnostic test results will improve Outcome: Progressing Goal: Respiratory complications will improve Outcome: Progressing Goal: Cardiovascular complication will be avoided Outcome: Progressing   Problem: Activity: Goal: Risk for activity intolerance will decrease Outcome: Progressing   Problem: Nutrition: Goal: Adequate nutrition will be maintained Outcome: Progressing   Problem: Coping: Goal: Level of anxiety will decrease Outcome: Progressing   Problem: Elimination: Goal: Will not experience complications related to bowel motility Outcome: Progressing Goal: Will not experience complications related to urinary retention Outcome: Progressing   Problem: Pain Managment: Goal: General experience of comfort will improve Outcome: Progressing   Problem: Safety: Goal: Ability to remain free from injury will improve Outcome: Progressing   Problem: Skin Integrity: Goal: Risk for impaired skin integrity will decrease Outcome: Progressing   Problem: Education: Goal: Understanding of cardiac disease, CV risk reduction, and recovery process will improve Outcome: Progressing Goal: Individualized Educational Video(s) Outcome: Progressing   Problem: Activity: Goal: Ability to tolerate increased activity will improve Outcome: Progressing   Problem: Cardiac: Goal: Ability to achieve and maintain adequate cardiovascular perfusion will  improve Outcome: Progressing   Problem: Health Behavior/Discharge Planning: Goal: Ability to safely manage health-related needs after discharge will improve Outcome: Progressing   Problem: Education: Goal: Knowledge of General Education information will improve Description: Including pain rating scale, medication(s)/side effects and non-pharmacologic comfort measures Outcome: Progressing   Problem: Health Behavior/Discharge Planning: Goal: Ability to manage health-related needs will improve Outcome: Progressing   Problem: Clinical Measurements: Goal: Ability to maintain clinical measurements within normal limits will improve Outcome: Progressing Goal: Will remain free from infection Outcome: Progressing Goal: Diagnostic test results will improve Outcome: Progressing Goal: Respiratory complications will improve Outcome: Progressing Goal: Cardiovascular complication will be avoided Outcome: Progressing   Problem: Activity: Goal: Risk for activity intolerance will decrease Outcome: Progressing   Problem: Nutrition: Goal: Adequate nutrition will be maintained Outcome: Progressing   Problem: Coping: Goal: Level of anxiety will decrease Outcome: Progressing   Problem: Elimination: Goal: Will not experience complications related to bowel motility Outcome: Progressing Goal: Will not experience complications related to urinary retention Outcome: Progressing   Problem: Pain Managment: Goal: General experience of comfort will improve Outcome: Progressing   Problem: Safety: Goal: Ability to remain free from injury will improve Outcome: Progressing   Problem: Skin Integrity: Goal: Risk for impaired skin integrity will decrease Outcome: Progressing   Problem: Education: Goal: Understanding of cardiac disease, CV risk reduction, and recovery process will improve Outcome: Progressing Goal: Individualized Educational Video(s) Outcome: Progressing   Problem:  Activity: Goal: Ability to tolerate increased activity will improve Outcome: Progressing   Problem: Cardiac: Goal: Ability to achieve and maintain adequate cardiovascular perfusion will improve Outcome: Progressing   Problem: Health Behavior/Discharge Planning: Goal: Ability to safely manage health-related needs after discharge will improve Outcome: Progressing

## 2018-11-17 NOTE — Progress Notes (Signed)
ANTICOAGULATION CONSULT NOTE - Initial Consult  Pharmacy Consult for heparin Indication: chest pain/ACS  Allergies  Allergen Reactions  . Shellfish Allergy Shortness Of Breath and Itching    Patient Measurements: Height: 5\' 11"  (180.3 cm) Weight: 250 lb (113.4 kg) IBW/kg (Calculated) : 75.3 Heparin Dosing Weight: 99 kg  Vital Signs: Temp: 98.2 F (36.8 C) (06/30 0306) Temp Source: Oral (06/30 0306) BP: 112/75 (06/30 0306) Pulse Rate: 57 (06/30 0306)  Labs: Recent Labs    11/16/18 1916 11/16/18 2148 11/17/18 0106 11/17/18 0235  HGB 14.5  --   --  13.7  HCT 43.0  --   --  41.0  PLT 193  --   --  179  CREATININE 1.35*  --   --  1.05  TROPONINIHS 7 50* 269* 353*    Estimated Creatinine Clearance: 105.3 mL/min (by C-G formula based on SCr of 1.05 mg/dL).   Medical History: Past Medical History:  Diagnosis Date  . Diabetes mellitus without complication ([place])   . Hypertension     Medications:  Scheduled:  . heparin  2,000 Units Intravenous Once  . insulin aspart  0-9 Units Subcutaneous TID WC    Assessment: Patient admitted for shoulder pain w/ reports of LS upper CP radiating down the L arm. Patient has a h/o DM w/o complications, and HTN. EKG showing some inverted T-waves and ST elevation that is no longer elevated in anterior leads, NSR. Initial trop was 50 >> 269 >> 353. Patient is being started in heparin drip for NSTEMI.  Goal of Therapy:  Heparin level 0.3-0.7 units/ml Monitor platelets by anticoagulation protocol: Yes   Plan:  Patient had originally received lovenox 40 mg subq x 1 for VTE prophlaxis; therefore considering trops are rising exponentially w/ a sensitive troponin assay, and patient being overweight, will give a half-bolus of heparin 2000 units IV x 1. Will start rate at 1400 units/hr. Baseline CBC WNL, PT/INR, and aPTT pending. Will check HL @ 0930. Will monitor daily CBC's and adjust per HL's.  Tobie Lords, PharmD, BCPS Clinical  Pharmacist 11/17/2018,3:36 AM

## 2018-11-17 NOTE — Consult Note (Signed)
[facility] Cardiology  CARDIOLOGY CONSULT NOTE  Patient ID: NAZAR KUAN MRN: [id number] DOB/AGE: [birth year] 53 y.o.  Admit date: 11/16/2018 Referring Physician Jannifer Franklin Primary Physician Wenda Low, MD Primary Cardiologist None per patient Reason for Consultation Chest pain  HPI: 53 year old referred for evaluation of chest pain and elevated troponin. The patient has a history of hypertension, type II diabetes, and history of tobacco abuse for about 15 years, quit in 2012, with no known prior cardiac history. The patient denies a known family history of CAD or sudden death. The patient reports experiencing acute onset of left upper chest and shoulder heaviness with radiation to the left subscapular region and left arm, which occurred while having a stressful discussion yesterday evening, and persisted for several hours. He had no associated shortness of breath, diaphoresis, or nausea. The patient denies a prior occurrence of this. He presented to [facility] ER where hs-troponin was borderline elevated to 50, 269, followed by 353. ECG revealed normal sinus rhythm at a rate of 72 bpm without acute ST-T wave abnormalities. The patient was started on heparin drip. The patient denies recurrent arm, chest, or shoulder discomfort. He feels well at this time.  Review of systems complete and found to be negative unless listed above     Past Medical History:  Diagnosis Date  . Diabetes mellitus without complication ([place])   . Hypertension     Past Surgical History:  Procedure Laterality Date  . COLONOSCOPY WITH PROPOFOL N/A 10/22/2016   Procedure: COLONOSCOPY WITH PROPOFOL;  Surgeon: Garlan Fair, MD;  Location: [facility] ENDOSCOPY;  Service: Endoscopy;  Laterality: N/A;    Medications Prior to Admission  Medication Sig Dispense Refill Last Dose  . albuterol (PROVENTIL HFA;VENTOLIN HFA) 108 (90 BASE) MCG/ACT inhaler Inhale 2 puffs into the lungs every 6 (six) hours as needed for wheezing.   Past Month at prn  .  amLODipine-benazepril (LOTREL) 10-40 MG per capsule Take 1 capsule by mouth daily.   11/16/2018 at 0600  . EPINEPHrine (EPI-PEN) 0.3 mg/0.3 mL SOAJ injection Inject 0.3 mg into the muscle once.   prn at prn  . hydrochlorothiazide (HYDRODIURIL) 25 MG tablet Take 12.5 mg by mouth daily.   11/16/2018 at 0600  . ipratropium (ATROVENT) 0.06 % nasal spray Place 2 sprays into both nostrils 4 (four) times daily. 15 mL 12 Past Month at prn  . metFORMIN (GLUCOPHAGE) 1000 MG tablet Take 500 mg by mouth daily with breakfast.    11/16/2018 at 0600  . PREVIDENT 5000 SENSITIVE 1.1-5 % PSTE TK UTD  4   . simvastatin (ZOCOR) 10 MG tablet Take 10 mg by mouth 2 (two) times a week.   Past Month at Unknown time   Social History   Socioeconomic History  . Marital status: Married    Spouse name: Not on file  . Number of children: Not on file  . Years of education: Not on file  . Highest education level: Not on file  Occupational History  . Not on file  Social Needs  . Financial resource strain: Not on file  . Food insecurity    Worry: Not on file    Inability: Not on file  . Transportation needs    Medical: Not on file    Non-medical: Not on file  Tobacco Use  . Smoking status: Former Smoker    Quit date: 10/23/2010    Years since quitting: 8.0  . Smokeless tobacco: Never Used  Substance and Sexual Activity  . Alcohol use: No  .  Drug use: No  . Sexual activity: Yes  Lifestyle  . Physical activity    Days per week: Not on file    Minutes per session: Not on file  . Stress: Not on file  Relationships  . Social Herbalist on phone: Not on file    Gets together: Not on file    Attends religious service: Not on file    Active member of club or organization: Not on file    Attends meetings of clubs or organizations: Not on file    Relationship status: Not on file  . Intimate partner violence    Fear of current or ex partner: Not on file    Emotionally abused: Not on file    Physically  abused: Not on file    Forced sexual activity: Not on file  Other Topics Concern  . Not on file  Social History Narrative  . Not on file    No family history on file.    Review of systems complete and found to be negative unless listed above      PHYSICAL EXAM  General: Well developed, well nourished, in no acute distress, lying in bed comfortably HEENT:  Normocephalic and atramatic Neck:  No JVD.  Lungs: Clear bilaterally to auscultation, normal effort of breathing on room air. Heart: HRRR . Normal S1 and S2 without gallops or murmurs.  Abdomen: nondistended Msk:  Back normal,gait not assessed. Normal strength and tone for age. Extremities: No clubbing, cyanosis or edema.   Neuro: Alert and oriented X 3. Psych:  Good affect, responds appropriately  Labs:   Lab Results  Component Value Date   WBC 7.8 11/17/2018   HGB 13.7 11/17/2018   HCT 41.0 11/17/2018   MCV 91.5 11/17/2018   PLT 179 11/17/2018    Recent Labs  Lab 11/17/18 0235  NA 140  K 3.6  CL 110  CO2 26  BUN 21*  CREATININE 1.05  CALCIUM 8.6*  GLUCOSE 138*   No results found for: CKTOTAL, CKMB, CKMBINDEX, TROPONINI No results found for: CHOL No results found for: HDL No results found for: LDLCALC No results found for: TRIG No results found for: CHOLHDL No results found for: LDLDIRECT    Radiology: Dg Chest 2 View  Result Date: 11/16/2018 CLINICAL DATA:  Chest, left shoulder and arm pain. EXAM: CHEST - 2 VIEW COMPARISON:  05/22/2016 and 02/07/2013 FINDINGS: The heart size and pulmonary vascularity are normal. Tiny area of atelectasis at the left lung base laterally. Old healed left lateral rib fractures. No acute bone abnormality. No effusions. IMPRESSION: No significant abnormalities. Electronically Signed   By: Lorriane Shire M.D.   On: 11/16/2018 20:30    EKG: NSR, 72 bpm  ASSESSMENT AND PLAN:  1. Chest pain with typical and atypical features with borderline elevated troponin with normal  ECG 2. Essential hypertension, controlled this morning 3. Type II diabetes  Plan: 1. The risks and benefits of cardiac catheterization with potential PCI were discussed with the patient and he agreed to proceed. 2. Proceed with cardiac catheterization tomorrow morning, 11/18/2018.  3. Continue heparin drip 4. Lipid panel 5. 2D echocardiogram 6. Add metoprolol succinate 25 mg once daily 7. Further recommendations pending patient's initial course   Signed: Sharolyn Douglas 11/17/2018, 7:58 AM

## 2018-11-17 NOTE — Progress Notes (Signed)
Dr. Marcille Blanco was notified of critical lab value of high sensitive troponin of 353 from . Dr. Marcille Blanco said he will place an order for heparin gtt

## 2018-11-17 NOTE — Progress Notes (Signed)
*  PRELIMINARY RESULTS* Echocardiogram 2D Echocardiogram has been performed.  Ronald Espinoza 11/17/2018, 2:04 PM

## 2018-11-17 NOTE — Progress Notes (Signed)
ANTICOAGULATION CONSULT NOTE - Initial Consult  Pharmacy Consult for heparin Indication: chest pain/ACS  Allergies  Allergen Reactions  . Shellfish Allergy Shortness Of Breath and Itching    Patient Measurements: Height: 5\' 11"  (180.3 cm) Weight: 250 lb (113.4 kg) IBW/kg (Calculated) : 75.3 Heparin Dosing Weight: 99 kg  Vital Signs: Temp: 98.1 F (36.7 C) (06/30 0715) Temp Source: Oral (06/30 0715) BP: 127/89 (06/30 0715) Pulse Rate: 63 (06/30 0715)  Labs: Recent Labs    11/16/18 1916 11/16/18 2148 11/17/18 0106 11/17/18 0235 11/17/18 0342 11/17/18 0934  HGB 14.5  --   --  13.7  --   --   HCT 43.0  --   --  41.0  --   --   PLT 193  --   --  179  --   --   APTT  --   --   --   --  31  --   LABPROT  --   --   --   --  12.7  --   INR  --   --   --   --  1.0  --   HEPARINUNFRC  --   --   --   --   --  0.58  CREATININE 1.35*  --   --  1.05  --   --   TROPONINIHS 7 50* 269* 353*  --   --     Estimated Creatinine Clearance: 105.3 mL/min (by C-G formula based on SCr of 1.05 mg/dL).   Medical History: Past Medical History:  Diagnosis Date  . Diabetes mellitus without complication ([place])   . Hypertension     Medications:  Scheduled:  . insulin aspart  0-9 Units Subcutaneous TID WC  . metoprolol succinate  25 mg Oral Daily  . sodium chloride flush  3 mL Intravenous Q12H    Assessment: Patient admitted for shoulder pain w/ reports of LS upper CP radiating down the L arm. Patient has a h/o DM w/o complications, and HTN. EKG showing some inverted T-waves and ST elevation that is no longer elevated in anterior leads, NSR. Initial trop was 50 >> 269 >> 353. Patient is being started in heparin drip for NSTEMI.  Heparin bolus 2000 units IV x 1 followed by 1400 units/hr infusion 6/30 @0934  HL 0.58.   Goal of Therapy:  Heparin level 0.3-0.7 units/ml Monitor platelets by anticoagulation protocol: Yes   Plan:  Heparin level is therapeutic.  Will continue rate at 1400  units/hr. Will check HL @ 1500. Will monitor daily CBC's and adjust per HL's. Plan for [facility] 7/1.   Eleonore Chiquito, PharmD, BCPS Clinical Pharmacist 11/17/2018,10:05 AM

## 2018-11-18 ENCOUNTER — Encounter
Admission: EM | Disposition: A | Discharge status: Home / Self Care | Payer: Self-pay | Source: Home / Self Care | Attending: Emergency Medicine

## 2018-11-18 ENCOUNTER — Encounter: Payer: Self-pay | Admitting: Cardiology

## 2018-11-18 HISTORY — PX: LEFT HEART CATH AND CORONARY ANGIOGRAPHY: CATH118249

## 2018-11-18 LAB — BASIC METABOLIC PANEL
Anion gap: 9 (ref 5–15)
BUN: 17 mg/dL (ref 6–20)
CO2: 23 mmol/L (ref 22–32)
Calcium: 8.8 mg/dL — ABNORMAL LOW (ref 8.9–10.3)
Chloride: 108 mmol/L (ref 98–111)
Creatinine, Ser: 1 mg/dL (ref 0.61–1.24)
GFR calc Af Amer: >60 mL/min (ref >60)
GFR calc non Af Amer: >60 mL/min (ref >60)
Glucose, Bld: 92 mg/dL (ref 70–99)
Potassium: 3.7 mmol/L (ref 3.5–5.1)
Sodium: 140 mmol/L (ref 135–145)

## 2018-11-18 LAB — CBC
HCT: 43.6 % (ref 39.0–52.0)
Hemoglobin: 14.6 g/dL (ref 13.0–17.0)
MCH: 31.1 pg (ref 26.0–34.0)
MCHC: 33.5 g/dL (ref 30.0–36.0)
MCV: 93 fL (ref 80.0–100.0)
Platelets: 187 10*3/uL (ref 150–400)
RBC: 4.69 MIL/uL (ref 4.22–5.81)
RDW: 14.1 % (ref 11.5–15.5)
WBC: 8.1 10*3/uL (ref 4.0–10.5)
nRBC: 0 % (ref 0.0–0.2)

## 2018-11-18 LAB — ECHOCARDIOGRAM COMPLETE
Height: 71 in
Weight: 4000 oz

## 2018-11-18 LAB — GLUCOSE, CAPILLARY
Glucose-Capillary: 103 mg/dL — ABNORMAL HIGH (ref 70–99)
Glucose-Capillary: 95 mg/dL (ref 70–99)
Glucose-Capillary: 145 mg/dL — ABNORMAL HIGH (ref 70–99)

## 2018-11-18 LAB — HIV ANTIBODY (ROUTINE TESTING W REFLEX): HIV Screen 4th Generation wRfx: NONREACTIVE

## 2018-11-18 LAB — NOVEL CORONAVIRUS, NAA (HOSP ORDER, SEND-OUT TO REF LAB; TAT 18-24 HRS): SARS-CoV-2, NAA: NOT DETECTED

## 2018-11-18 LAB — MAGNESIUM: Magnesium: 2.3 mg/dL (ref 1.7–2.4)

## 2018-11-18 LAB — HEPARIN LEVEL (UNFRACTIONATED): Heparin Unfractionated: 0.47 IU/mL (ref 0.30–0.70)

## 2018-11-18 SURGERY — LEFT HEART CATH AND CORONARY ANGIOGRAPHY
Anesthesia: Moderate Sedation

## 2018-11-18 MED ORDER — SODIUM CHLORIDE 0.9 % IV SOLN: 250.0000 mL | INTRAVENOUS | Status: DC | PRN

## 2018-11-18 MED ORDER — ONDANSETRON HCL 4 MG/2ML IJ SOLN: 4.0000 mg | Freq: Four times a day (QID) | INTRAMUSCULAR | Status: DC | PRN

## 2018-11-18 MED ORDER — MIDAZOLAM HCL 2 MG/2ML IJ SOLN
INTRAMUSCULAR | Status: AC
  Filled 2018-11-18: qty 2

## 2018-11-18 MED ORDER — FENTANYL CITRATE (PF) 100 MCG/2ML IJ SOLN
INTRAMUSCULAR | Status: DC | PRN
  Administered 2018-11-18: 50 ug via INTRAVENOUS

## 2018-11-18 MED ORDER — SODIUM CHLORIDE 0.9% FLUSH: 3.0000 mL | Freq: Two times a day (BID) | INTRAVENOUS | Status: DC

## 2018-11-18 MED ORDER — VERAPAMIL HCL 2.5 MG/ML IV SOLN
INTRAVENOUS | Status: DC | PRN
  Administered 2018-11-18: 2.5 mg via INTRA_ARTERIAL

## 2018-11-18 MED ORDER — SODIUM CHLORIDE 0.9% FLUSH: 3.0000 mL | INTRAVENOUS | Status: DC | PRN

## 2018-11-18 MED ORDER — VERAPAMIL HCL 2.5 MG/ML IV SOLN
INTRAVENOUS | Status: AC
  Filled 2018-11-18: qty 2

## 2018-11-18 MED ORDER — IOHEXOL 300 MG/ML  SOLN
INTRAMUSCULAR | Status: DC | PRN
  Administered 2018-11-18: 90 mL via INTRA_ARTERIAL

## 2018-11-18 MED ORDER — ACETAMINOPHEN 325 MG PO TABS: 650.0000 mg | ORAL_TABLET | ORAL | Status: DC | PRN

## 2018-11-18 MED ORDER — SODIUM CHLORIDE 0.9 % WEIGHT BASED INFUSION: 1.0000 mL/kg/h | INTRAVENOUS | Status: DC

## 2018-11-18 MED ORDER — METOPROLOL SUCCINATE ER 25 MG PO TB24: 25.0000 mg | ORAL_TABLET | Freq: Every day | ORAL | 0 refills | Status: AC

## 2018-11-18 MED ORDER — METFORMIN HCL 1000 MG PO TABS: 500.0000 mg | ORAL_TABLET | Freq: Every day | ORAL | 0 refills | Status: DC

## 2018-11-18 MED ORDER — LABETALOL HCL 5 MG/ML IV SOLN: 10.0000 mg | INTRAVENOUS | Status: DC | PRN

## 2018-11-18 MED ORDER — HEPARIN (PORCINE) IN NACL 1000-0.9 UT/500ML-% IV SOLN
INTRAVENOUS | Status: DC | PRN
  Administered 2018-11-18: 500 mL

## 2018-11-18 MED ORDER — HYDRALAZINE HCL 20 MG/ML IJ SOLN: 10.0000 mg | INTRAMUSCULAR | Status: DC | PRN

## 2018-11-18 MED ORDER — FENTANYL CITRATE (PF) 100 MCG/2ML IJ SOLN
INTRAMUSCULAR | Status: AC
  Filled 2018-11-18: qty 2

## 2018-11-18 MED ORDER — HEPARIN SODIUM (PORCINE) 1000 UNIT/ML IJ SOLN
INTRAMUSCULAR | Status: DC | PRN
  Administered 2018-11-18: 5000 [IU] via INTRAVENOUS

## 2018-11-18 MED ORDER — HEPARIN (PORCINE) IN NACL 1000-0.9 UT/500ML-% IV SOLN
INTRAVENOUS | Status: AC
  Filled 2018-11-18: qty 1000

## 2018-11-18 MED ORDER — HEPARIN SODIUM (PORCINE) 1000 UNIT/ML IJ SOLN
INTRAMUSCULAR | Status: AC
  Filled 2018-11-18: qty 1

## 2018-11-18 MED ORDER — MIDAZOLAM HCL 2 MG/2ML IJ SOLN
INTRAMUSCULAR | Status: DC | PRN
  Administered 2018-11-18: 1 mg via INTRAVENOUS

## 2018-11-18 SURGICAL SUPPLY — 8 items
CATH INFINITI 5 FR JL3.5 (CATHETERS) ×2 IMPLANT
CATH INFINITI 5FR ANG PIGTAIL (CATHETERS) ×2 IMPLANT
CATH INFINITI JR4 5F (CATHETERS) ×2 IMPLANT
DEVICE RAD TR BAND REGULAR (VASCULAR PRODUCTS) ×2 IMPLANT
GLIDESHEATH SLEND SS 6F .021 (SHEATH) ×2 IMPLANT
KIT MANI 3VAL PERCEP (MISCELLANEOUS) ×3 IMPLANT
PACK CARDIAC CATH (CUSTOM PROCEDURE TRAY) ×3 IMPLANT
WIRE ROSEN-J .035X260CM (WIRE) ×4 IMPLANT

## 2018-11-18 NOTE — Progress Notes (Signed)
Patient has no acute event overnight, remained NSR with stable VS. NPO overnight for scheduled heart cath in AM.

## 2018-11-18 NOTE — Progress Notes (Signed)
Patient alert and oriented, vss, no complaints of pain.  Patient had radial cath today.  Given vascular discharge instructions.  Patient able to repeat back information.  Chest pain free.  F/U appointment with Dr. Saralyn Pilar.  D/C telemetry and PIV.  No questions at this time.  Patient escorted out of hospital via wheelchair by volunteers.

## 2018-11-18 NOTE — TOC Transition Note (Signed)
Transition of Care [facility]) - CM/SW Discharge Note   Patient Details  Name: Ronald Espinoza MRN: [id number] Date of Birth: February 26, 1966  [facility] CM/SW Contact:  Katrina Stack, RN Phone Number: 11/18/2018, 11:55 AM   Clinical Narrative:    Clean cardiac cath.  Should discharge today.  No discharge needs          Patient Goals and CMS Choice        Discharge Placement                       Discharge Plan and Services                                     Social Determinants of Health (SDOH) Interventions     Readmission Risk Interventions No flowsheet data found.

## 2018-11-18 NOTE — Progress Notes (Signed)
ANTICOAGULATION CONSULT NOTE - Initial Consult  Pharmacy Consult for heparin Indication: chest pain/ACS  Allergies  Allergen Reactions  . Shellfish Allergy Shortness Of Breath and Itching    Patient Measurements: Height: 5\' 11"  (180.3 cm) Weight: 250 lb (113.4 kg) IBW/kg (Calculated) : 75.3 Heparin Dosing Weight: 99 kg  Vital Signs: Temp: 97.7 F (36.5 C) (07/01 0310) Temp Source: Oral (07/01 0310) BP: 117/84 (07/01 0310) Pulse Rate: 57 (07/01 0310)  Labs: Recent Labs    11/16/18 1916 11/16/18 2148 11/17/18 0106 11/17/18 0235 11/17/18 0342  11/17/18 1511 11/17/18 2059 11/18/18 0254  HGB 14.5  --   --  13.7  --   --   --   --  14.6  HCT 43.0  --   --  41.0  --   --   --   --  43.6  PLT 193  --   --  179  --   --   --   --  187  APTT  --   --   --   --  31  --   --   --   --   LABPROT  --   --   --   --  12.7  --   --   --   --   INR  --   --   --   --  1.0  --   --   --   --   HEPARINUNFRC  --   --   --   --   --    < > 0.40 0.44 0.47  CREATININE 1.35*  --   --  1.05  --   --   --   --  1.00  TROPONINIHS 7 50* 269* 353*  --   --   --   --   --    < > = values in this interval not displayed.    Estimated Creatinine Clearance: 110.6 mL/min (by C-G formula based on SCr of 1 mg/dL).   Medical History: Past Medical History:  Diagnosis Date  . Diabetes mellitus without complication ([place])   . Hypertension     Medications:  Scheduled:  . amLODipine  10 mg Oral Daily   And  . benazepril  40 mg Oral Daily  . aspirin  81 mg Oral Pre-Cath  . hydrochlorothiazide  12.5 mg Oral Daily  . insulin aspart  0-9 Units Subcutaneous TID WC  . ipratropium  2 spray Each Nare QID  . metoprolol succinate  25 mg Oral Daily  . [START ON 11/19/2018] simvastatin  10 mg Oral 2 times weekly  . sodium chloride flush  3 mL Intravenous Q12H    Assessment: Patient admitted for shoulder pain w/ reports of LS upper CP radiating down the L arm. Patient has a h/o DM w/o complications, and  HTN. EKG showing some inverted T-waves and ST elevation that is no longer elevated in anterior leads, NSR. Initial trop was 50 >> 269 >> 353. Patient is being started in heparin drip for NSTEMI.  Heparin bolus 2000 units IV x 1 followed by 1400 units/hr infusion 6/30 @0934  HL 0.58.  6/30 @1511  HL 0.40.  Goal of Therapy:  Heparin level 0.3-0.7 units/ml Monitor platelets by anticoagulation protocol: Yes   Plan:  07/01 @ 0300 HL 0.47 therapeutic. Will continue current rate and will recheck HL w/ am labs. CBC stable will continue to monitor.  Tobie Lords, PharmD, BCPS Clinical Pharmacist 11/18/2018,5:04 AM

## 2018-11-18 NOTE — Discharge Summary (Signed)
[facility] [place] NAME: Ronald Espinoza    MR#:  [id number]  DATE OF BIRTH:  1966/04/10  DATE OF ADMISSION:  11/16/2018   ADMITTING PHYSICIAN: Lance Coon, MD  DATE OF DISCHARGE: 11/18/2018  PRIMARY CARE PHYSICIAN: Wenda Low, MD   ADMISSION DIAGNOSIS:  Atypical chest pain [R07.89] Elevated troponin [R79.89] DISCHARGE DIAGNOSIS:  Principal Problem:   Chest pain Active Problems:   HTN (hypertension)   Diabetes ([place])  SECONDARY DIAGNOSIS:   Past Medical History:  Diagnosis Date   Diabetes mellitus without complication ([place])    Hypertension    HOSPITAL COURSE:  Chief complaint; chest pain  History of presenting complaint; Ronald Espinoza  is a 53 y.o. male who presented to the ED after an episode of left-sided upper chest pain, shoulder pain, radiating down his left arm.  Pain has since resolved.  Initial troponin in the ED was negative, however second troponin became positive.  Hospitalist called for admission   Hospital course; 1.Chest pain -symptoms have resolved.  Patient remains asymptomatic. Patient with borderline elevated troponin with normal EKG.  Seen by cardiologist.  Patient had 2D echocardiogram done which revealed normal ejection fraction of 55 to 60%.  Cardiac catheterization with insignificant coronary artery disease.  Normal ejection fraction.  Recommended medical therapy with risk factor modifications.  Cleared for discharge home by cardiologist with plans to follow-up in 1 week. COVID test is negative.  Heparin drip discontinued already. Lipid panel done with LDL of 121. Chest pain most likely musculoskeletal in origin.  2. HTN (hypertension) - Blood pressure controlled on current regimen.  Continue the same on discharge  3.Diabetes ([place]) Blood sugars controlled.  Patient advised to hold off on metformin for 72 hours following cardiac catheterization with contrast exposure.    Disposition; patient  clinically and hemodynamically stable.  Remains chest pain-free.  Wishes to be discharged home today.  DISCHARGE CONDITIONS:  Stable CONSULTS OBTAINED:  Treatment Team:  Isaias Cowman, MD DRUG ALLERGIES:   Allergies  Allergen Reactions   Shellfish Allergy Shortness Of Breath and Itching   DISCHARGE MEDICATIONS:   Allergies as of 11/18/2018      Reactions   Shellfish Allergy Shortness Of Breath, Itching      Medication List    TAKE these medications   albuterol 108 (90 Base) MCG/ACT inhaler Commonly known as: VENTOLIN HFA Inhale 2 puffs into the lungs every 6 (six) hours as needed for wheezing.   amLODipine-benazepril 10-40 MG capsule Commonly known as: LOTREL Take 1 capsule by mouth daily.   EPINEPHrine 0.3 mg/0.3 mL Soaj injection Commonly known as: EPI-PEN Inject 0.3 mg into the muscle once.   hydrochlorothiazide 25 MG tablet Commonly known as: HYDRODIURIL Take 12.5 mg by mouth daily.   ipratropium 0.06 % nasal spray Commonly known as: Atrovent Place 2 sprays into both nostrils 4 (four) times daily.   metFORMIN 1000 MG tablet Commonly known as: GLUCOPHAGE Take 0.5 tablets (500 mg total) by mouth daily with breakfast. Start taking on: November 21, 2018 What changed: These instructions start on November 21, 2018. If you are unsure what to do until then, ask your doctor or other care provider.   metoprolol succinate 25 MG 24 hr tablet Commonly known as: TOPROL-XL Take 1 tablet (25 mg total) by mouth daily. Start taking on: November 19, 2018   PreviDent 5000 Sensitive 1.1-5 % Pste Generic drug: Sod Fluoride-Potassium Nitrate TK UTD   simvastatin 10 MG tablet Commonly known as:  ZOCOR Take 10 mg by mouth 2 (two) times a week.        DISCHARGE INSTRUCTIONS:   DIET:  Cardiac diet and Diabetic diet DISCHARGE CONDITION:  Stable ACTIVITY:  Activity as tolerated OXYGEN:  Home Oxygen: No.  Oxygen Delivery: room air DISCHARGE LOCATION:  home   If you  experience worsening of your admission symptoms, develop shortness of breath, life threatening emergency, suicidal or homicidal thoughts you must seek medical attention immediately by calling 911 or calling your MD immediately  if symptoms less severe.  You Must read complete instructions/literature along with all the possible adverse reactions/side effects for all the Medicines you take and that have been prescribed to you. Take any new Medicines after you have completely understood and accpet all the possible adverse reactions/side effects.   Please note  You were cared for by a hospitalist during your hospital stay. If you have any questions about your discharge medications or the care you received while you were in the hospital after you are discharged, you can call the unit and asked to speak with the hospitalist on call if the hospitalist that took care of you is not available. Once you are discharged, your primary care physician will handle any further medical issues. Please note that NO REFILLS for any discharge medications will be authorized once you are discharged, as it is imperative that you return to your primary care physician (or establish a relationship with a primary care physician if you do not have one) for your aftercare needs so that they can reassess your need for medications and monitor your lab values.    On the day of Discharge:  VITAL SIGNS:  Blood pressure 118/86, pulse (!) 57, temperature 97.9 F (36.6 C), temperature source Oral, resp. rate 15, height 5\' 11"  (1.803 m), weight 113.4 kg, SpO2 97 %. PHYSICAL EXAMINATION:  GENERAL:  53 y.o.-year-old patient lying in the bed with no acute distress.  EYES: Pupils equal, round, reactive to light and accommodation. No scleral icterus. Extraocular muscles intact.  HEENT: Head atraumatic, normocephalic. Oropharynx and nasopharynx clear.  NECK:  Supple, no jugular venous distention. No thyroid enlargement, no tenderness.  LUNGS:  Normal breath sounds bilaterally, no wheezing, rales,rhonchi or crepitation. No use of accessory muscles of respiration.  CARDIOVASCULAR: S1, S2 normal. No murmurs, rubs, or gallops.  ABDOMEN: Soft, non-tender, non-distended. Bowel sounds present. No organomegaly or mass.  EXTREMITIES: No pedal edema, cyanosis, or clubbing.  NEUROLOGIC: Cranial nerves II through XII are intact. Muscle strength 5/5 in all extremities. Sensation intact. Gait not checked.  PSYCHIATRIC: The patient is alert and oriented x 3.  SKIN: No obvious rash, lesion, or ulcer.  DATA REVIEW:   CBC Recent Labs  Lab 11/18/18 0254  WBC 8.1  HGB 14.6  HCT 43.6  PLT 187    Chemistries  Recent Labs  Lab 11/18/18 0254  NA 140  K 3.7  CL 108  CO2 23  GLUCOSE 92  BUN 17  CREATININE 1.00  CALCIUM 8.8*  MG 2.3     Microbiology Results  Results for orders placed or performed during the hospital encounter of 11/30/14  Culture, Group A Strep     Status: Abnormal   Collection Time: 11/30/14  8:17 PM  Result Value Ref Range Status   Strep A Culture Comment (A)  Final    Comment: (NOTE) Beta-hemolytic colonies, not group A Streptococcus isolated. Performed At: [facility] [place], [place] [id number] Lindon Romp MD  UX:[id number]     RADIOLOGY:  No results found.   Management plans discussed with the patient, family and they are in agreement.  CODE STATUS: Full Code   TOTAL TIME TAKING CARE OF THIS PATIENT: 36 minutes.    Ronald Espinoza M.D on 11/18/2018 at 12:56 PM  Between 7am to 6pm - Pager - [phone]  After 6pm go to [email or web address] - Proofreader  [facility]  Office  [phone]  CC: Primary care physician; Wenda Low, MD   Note: This dictation was prepared with Dragon dictation along with smaller phrase technology. Any transcriptional errors that result from this process are unintentional.

## 2019-07-29 ENCOUNTER — Ambulatory Visit: Payer: BC Managed Care – PPO | Attending: Family

## 2019-08-31 ENCOUNTER — Ambulatory Visit: Payer: BC Managed Care – PPO | Attending: Internal Medicine

## 2019-08-31 DIAGNOSIS — Z23 Encounter for immunization: Secondary | ICD-10-CM

## 2019-08-31 NOTE — Progress Notes (Signed)
   Covid-19 Vaccination Clinic  Name:  BUNNY SMEDBERG    MRN: [id number] DOB: May 27, 1965  08/31/2019  Mr. Nooney was observed post Covid-19 immunization for 15 minutes without incident. He was provided with Vaccine Information Sheet and instruction to access the V-Safe system.   Mr. Leer was instructed to call 911 with any severe reactions post vaccine: Marland Kitchen Difficulty breathing  . Swelling of face and throat  . A fast heartbeat  . A bad rash all over body  . Dizziness and weakness   Immunizations Administered    Name Date Dose VIS Date Route   Moderna COVID-19 Vaccine 08/31/2019  1:34 PM 0.5 mL 04/20/2019 Intramuscular   Manufacturer: Moderna   Lot: [id number]   [place][id number]

## 2020-01-25 ENCOUNTER — Ambulatory Visit: Payer: BC Managed Care – PPO | Attending: Internal Medicine

## 2020-01-25 ENCOUNTER — Ambulatory Visit: Payer: Self-pay

## 2020-01-25 DIAGNOSIS — Z23 Encounter for immunization: Secondary | ICD-10-CM

## 2020-01-25 NOTE — Progress Notes (Signed)
   Covid-19 Vaccination Clinic  Name:  Ronald Espinoza    MRN: [id number] DOB: [birth year]  01/25/2020  [name] was observed post Covid-19 immunization for 30 minutes based on pre-vaccination screening without incident. He was provided with Vaccine Information Sheet and instruction to access the V-Safe system.   [name] was instructed to call 911 with any severe reactions post vaccine: Marland Kitchen Difficulty breathing  . Swelling of face and throat  . A fast heartbeat  . A bad rash all over body  . Dizziness and weakness

## 2021-07-26 ENCOUNTER — Other Ambulatory Visit: Payer: Self-pay | Admitting: Internal Medicine

## 2021-07-26 DIAGNOSIS — R1031 Right lower quadrant pain: Secondary | ICD-10-CM

## 2021-07-26 DIAGNOSIS — R300 Dysuria: Secondary | ICD-10-CM

## 2021-07-31 ENCOUNTER — Other Ambulatory Visit: Payer: Self-pay | Admitting: Internal Medicine

## 2021-07-31 DIAGNOSIS — R300 Dysuria: Secondary | ICD-10-CM

## 2021-07-31 DIAGNOSIS — R1031 Right lower quadrant pain: Secondary | ICD-10-CM

## 2021-08-07 ENCOUNTER — Other Ambulatory Visit: Payer: Self-pay

## 2021-08-07 ENCOUNTER — Ambulatory Visit
Admission: RE | Admit: 2021-08-07 | Discharge: 2021-08-07 | Disposition: A | Discharge status: Home / Self Care | Payer: BC Managed Care – PPO | Source: Ambulatory Visit | Attending: Internal Medicine | Admitting: Internal Medicine

## 2021-08-07 DIAGNOSIS — R300 Dysuria: Secondary | ICD-10-CM

## 2021-08-07 DIAGNOSIS — R1031 Right lower quadrant pain: Secondary | ICD-10-CM

## 2021-09-28 ENCOUNTER — Other Ambulatory Visit: Payer: Self-pay | Admitting: Urology

## 2021-09-28 ENCOUNTER — Other Ambulatory Visit (HOSPITAL_COMMUNITY): Payer: Self-pay | Admitting: Urology

## 2021-09-28 DIAGNOSIS — N281 Cyst of kidney, acquired: Secondary | ICD-10-CM

## 2021-10-01 ENCOUNTER — Encounter: Payer: Self-pay | Admitting: *Deleted

## 2021-10-01 NOTE — Progress Notes (Unsigned)
Ronald Daft, MD  [facility] Maser ?Schedule for CT guided renal cyst aspirations, may want [place] in CT.  See Urology note.  Plan for image guided right renal cyst aspirations.  ? ?[place]   ?  ?   ? ?

## 2021-10-03 ENCOUNTER — Encounter: Payer: Self-pay | Admitting: Student

## 2021-10-03 ENCOUNTER — Other Ambulatory Visit: Payer: Self-pay | Admitting: Radiology

## 2021-10-03 ENCOUNTER — Other Ambulatory Visit: Payer: Self-pay | Admitting: Student

## 2021-10-03 DIAGNOSIS — N281 Cyst of kidney, acquired: Secondary | ICD-10-CM

## 2021-10-04 ENCOUNTER — Other Ambulatory Visit (HOSPITAL_COMMUNITY): Payer: Self-pay | Admitting: Urology

## 2021-10-04 ENCOUNTER — Ambulatory Visit (HOSPITAL_COMMUNITY)
Admission: RE | Admit: 2021-10-04 | Discharge: 2021-10-04 | Disposition: A | Discharge status: Home / Self Care | Payer: BC Managed Care – PPO | Source: Ambulatory Visit | Attending: Urology | Admitting: Urology

## 2021-10-04 ENCOUNTER — Other Ambulatory Visit (HOSPITAL_COMMUNITY): Payer: Self-pay | Admitting: Interventional Radiology

## 2021-10-04 ENCOUNTER — Encounter (HOSPITAL_COMMUNITY): Payer: Self-pay

## 2021-10-04 DIAGNOSIS — R1031 Right lower quadrant pain: Secondary | ICD-10-CM | POA: Insufficient documentation

## 2021-10-04 DIAGNOSIS — E119 Type 2 diabetes mellitus without complications: Secondary | ICD-10-CM | POA: Diagnosis not present

## 2021-10-04 DIAGNOSIS — N281 Cyst of kidney, acquired: Secondary | ICD-10-CM | POA: Diagnosis present

## 2021-10-04 DIAGNOSIS — I10 Essential (primary) hypertension: Secondary | ICD-10-CM | POA: Diagnosis not present

## 2021-10-04 LAB — CBC
HCT: 40 % (ref 39.0–52.0)
Hemoglobin: 13.8 g/dL (ref 13.0–17.0)
MCH: 31.9 pg (ref 26.0–34.0)
MCHC: 34.5 g/dL (ref 30.0–36.0)
MCV: 92.6 fL (ref 80.0–100.0)
Platelets: 200 10*3/uL (ref 150–400)
RBC: 4.32 MIL/uL (ref 4.22–5.81)
RDW: 14.3 % (ref 11.5–15.5)
WBC: 9.3 10*3/uL (ref 4.0–10.5)
nRBC: 0 % (ref 0.0–0.2)

## 2021-10-04 LAB — PROTIME-INR
INR: 1 (ref 0.8–1.2)
Prothrombin Time: 13 seconds (ref 11.4–15.2)

## 2021-10-04 MED ORDER — AMLODIPINE BESYLATE 10 MG PO TABS
10.0000 mg | ORAL_TABLET | ORAL | Status: AC
  Administered 2021-10-04: 10 mg via ORAL
  Filled 2021-10-04: qty 1

## 2021-10-04 MED ORDER — GELATIN ABSORBABLE 12-7 MM EX MISC
CUTANEOUS | Status: AC
  Filled 2021-10-04: qty 1

## 2021-10-04 MED ORDER — BENAZEPRIL HCL 40 MG PO TABS
40.0000 mg | ORAL_TABLET | ORAL | Status: AC
  Administered 2021-10-04: 40 mg via ORAL
  Filled 2021-10-04: qty 1

## 2021-10-04 MED ORDER — SODIUM CHLORIDE 0.9 % IV SOLN: INTRAVENOUS | Status: DC

## 2021-10-04 MED ORDER — FENTANYL CITRATE (PF) 100 MCG/2ML IJ SOLN
INTRAMUSCULAR | Status: AC | PRN
Start: 2021-10-04 — End: 2021-10-04
  Administered 2021-10-04 (×2): 50 ug via INTRAVENOUS

## 2021-10-04 MED ORDER — MIDAZOLAM HCL 2 MG/2ML IJ SOLN
INTRAMUSCULAR | Status: AC | PRN
  Administered 2021-10-04 (×2): 1 mg via INTRAVENOUS

## 2021-10-04 MED ORDER — LIDOCAINE HCL 1 % IJ SOLN
INTRAMUSCULAR | Status: AC
  Filled 2021-10-04: qty 10

## 2021-10-04 MED ORDER — SODIUM CHLORIDE 0.9 % IV SOLN
INTRAVENOUS | Status: AC | PRN
Start: 2021-10-04 — End: 2021-10-04
  Administered 2021-10-04: 10 mL/h via INTRAVENOUS

## 2021-10-04 MED ORDER — MIDAZOLAM HCL 2 MG/2ML IJ SOLN
INTRAMUSCULAR | Status: AC
Start: 2021-10-04 — End: 2021-10-04
  Filled 2021-10-04: qty 2

## 2021-10-04 MED ORDER — METOPROLOL SUCCINATE ER 25 MG PO TB24
25.0000 mg | ORAL_TABLET | ORAL | Status: AC
  Administered 2021-10-04: 25 mg via ORAL
  Filled 2021-10-04: qty 1

## 2021-10-04 MED ORDER — FENTANYL CITRATE (PF) 100 MCG/2ML IJ SOLN
INTRAMUSCULAR | Status: AC
  Filled 2021-10-04: qty 2

## 2021-10-04 NOTE — Procedures (Signed)
Interventional Radiology Procedure Note  Procedure: CT guided right renal cyst aspiration  Indication: Right flank pain  Findings: Please refer to procedural dictation for full description.  Complications: None  EBL: < 10 mL  Miachel Roux, MD [phone]

## 2021-10-04 NOTE — H&P (Signed)
Chief Complaint: Patient was seen in consultation today for No chief complaint on file.  at the request of [place]  Referring Physician(s): [place]  Supervising Physician: Mir, Biochemist, clinical  Patient Status: [facility] - Out-pt  History of Present Illness: Ronald Espinoza is a 56 y.o. male with history of DMII and hypertension who has been having intermittent RLQ pain and urethral discomfort with urination for several months.  Denies gross hematuria.  Denies STIs.  CT revealed renal cysts of the right kidney.  He has been approved for right renal cyst aspiration in hopes it will help with uncomfortable symptoms.  Past Medical History:  Diagnosis Date   Diabetes mellitus without complication ([place])    Hypertension     Past Surgical History:  Procedure Laterality Date   COLONOSCOPY WITH PROPOFOL N/A 10/22/2016   Procedure: COLONOSCOPY WITH PROPOFOL;  Surgeon: Garlan Fair, MD;  Location: [facility] ENDOSCOPY;  Service: Endoscopy;  Laterality: N/A;   LEFT HEART CATH AND CORONARY ANGIOGRAPHY N/A 11/18/2018   Procedure: LEFT HEART CATH AND CORONARY ANGIOGRAPHY with potential PCI;  Surgeon: Isaias Cowman, MD;  Location: [place] [facility] CV LAB;  Service: Cardiovascular;  Laterality: N/A;    Allergies: Other and Shellfish allergy  Medications: Prior to Admission medications   Medication Sig Start Date End Date Taking? Authorizing Provider  albuterol (PROVENTIL HFA;VENTOLIN HFA) 108 (90 BASE) MCG/ACT inhaler Inhale 2 puffs into the lungs every 6 (six) hours as needed for wheezing.   Yes [provider]  amLODipine-benazepril (LOTREL) 10-40 MG per capsule Take 1 capsule by mouth daily.   Yes [provider]  aspirin EC 81 MG tablet Take 81 mg by mouth daily. Swallow whole.   Yes [provider]  azelastine (ASTELIN) 0.1 % nasal spray Place 2 sprays into both nostrils 2 (two) times daily as needed for rhinitis. Use in each nostril as directed   Yes  [provider]  cetirizine (ZYRTEC) 10 MG tablet Take 10 mg by mouth daily as needed for allergies.   Yes [provider]  EPINEPHrine (EPI-PEN) 0.3 mg/0.3 mL SOAJ injection Inject 0.3 mg into the muscle once.   Yes [provider]  hydrochlorothiazide (HYDRODIURIL) 25 MG tablet Take 25 mg by mouth daily.   Yes [provider]  ibuprofen (ADVIL) 200 MG tablet Take 600-800 mg by mouth every 6 (six) hours as needed for moderate pain.   Yes [provider]  metFORMIN (GLUCOPHAGE) 500 MG tablet Take 500 mg by mouth daily with breakfast.   Yes [provider]  metoprolol succinate (TOPROL-XL) 25 MG 24 hr tablet Take 1 tablet (25 mg total) by mouth daily. 11/19/18  Yes Ojie, Jude, MD  Naphazoline-Pheniramine (OPCON-A OP) Place 1 drop into both eyes daily as needed (allergies).   Yes [provider]  simvastatin (ZOCOR) 10 MG tablet Take 10 mg by mouth daily. 06/16/18  Yes [provider]     History reviewed. No pertinent family history.  Social History   Socioeconomic History   Marital status: Married    Spouse name: Not on file   Number of children: Not on file   Years of education: Not on file   Highest education level: Not on file  Occupational History   Not on file  Tobacco Use   Smoking status: Former    Types: Cigarettes    Quit date: 10/23/2010    Years since quitting: 10.9   Smokeless tobacco: Never  Vaping Use   Vaping Use: Never used  Substance and Sexual Activity   Alcohol use: Yes    Comment: social   Drug use: No   Sexual activity: Yes  Other Topics Concern   Not on file  Social History Narrative   Not on file   Social Determinants of Health   Financial Resource Strain: Not on file  Food Insecurity: Not on file  Transportation Needs: Not on file  Physical Activity: Not on file  Stress: Not on file  Social Connections: Not on file    Review of Systems: A 12 point ROS discussed and pertinent  positives are indicated in the HPI above.  All other systems are negative.  Review of Systems  Constitutional: Negative.   HENT: Negative.    Eyes: Negative.   Respiratory: Negative.    Cardiovascular: Negative.   Gastrointestinal:  Positive for abdominal pain.  Endocrine: Negative.   Genitourinary:  Positive for dysuria.  Musculoskeletal: Negative.   Skin: Negative.   Allergic/Immunologic: Negative.   Neurological: Negative.   Hematological: Negative.   Psychiatric/Behavioral: Negative.     Vital Signs: BP (!) 141/103   Pulse 76   Temp 97.9 F (36.6 C) (Oral)   Resp 16   Ht '5\' 11"'$  (1.803 m)   Wt 280 lb (127 kg)   SpO2 95%   BMI 39.05 kg/m   Physical Exam HENT:     Mouth/Throat:     Mouth: Mucous membranes are moist.     Pharynx: Oropharynx is clear.  Eyes:     Extraocular Movements: Extraocular movements intact.  Cardiovascular:     Rate and Rhythm: Normal rate and regular rhythm.     Pulses: Normal pulses.     Heart sounds: Normal heart sounds.  Pulmonary:     Effort: Pulmonary effort is normal.     Breath sounds: Normal breath sounds.  Abdominal:     General: Abdomen is flat.     Palpations: Abdomen is soft.  Skin:    General: Skin is warm and dry.  Neurological:     General: No focal deficit present.     Mental Status: He is alert and oriented to person, place, and time.  Psychiatric:        Mood and Affect: Mood normal.        Behavior: Behavior normal.    Imaging: No results found.  Labs:  CBC: Recent Labs    10/04/21 0640  WBC 9.3  HGB 13.8  HCT 40.0  PLT 200    COAGS: Recent Labs    10/04/21 0640  INR 1.0    BMP: No results for input(s): NA, K, CL, CO2, GLUCOSE, BUN, CALCIUM, CREATININE, GFRNONAA, GFRAA in the last 8760 hours.  Invalid input(s): CMP  LIVER FUNCTION TESTS: No results for input(s): BILITOT, AST, ALT, ALKPHOS, PROT, ALBUMIN in the last 8760 hours.  TUMOR MARKERS: No results for input(s): AFPTM, CEA, CA199,  CHROMGRNA in the last 8760 hours.  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  All of the questions were answered and there is agreement to proceed.  Consent signed and in chart.   Thank you for this interesting consult.  I greatly enjoyed meeting Ronald Espinoza and look forward to participating in  their care.  A copy of this report was sent to the requesting provider on this date.  Electronically Signed: Pasty Spillers, PA 10/04/2021, 7:21 AM   I spent a total of 30 Minutes  in face to face in clinical consultation, greater than 50% of which was counseling/coordinating care for renal cyst aspiration

## 2021-10-04 NOTE — Sedation Documentation (Addendum)
Total cyst aspiration 600 mls clear yellow with sanguinous tinge

## 2021-10-05 LAB — GLUCOSE, CAPILLARY: Glucose-Capillary: 123 mg/dL — ABNORMAL HIGH (ref 70–99)

## 2021-10-05 LAB — CYTOLOGY - NON PAP

## 2023-11-03 IMAGING — CT CT GUIDANCE NEEDLE PLACEMENT
1 of 7 series · 9 of 32 positions shown, 15 images · non-contrast
Comparison: none

INDICATION: 55-year-old gentleman with a right lower quadrant pain and dysuria
presents to IR for CT-guided drainage.

EXAM:
CT-guided right renal cyst drainage x3
TECHNIQUE: Multidetector CT imaging of the abdomen was performed following the
standard protocol without IV contrast.

[Series 2: i-spiral 5.0 b40f · axial · 0.90mm/px · z∈[+1074,+1322]mm · 9 of 89 slices shown, 15 images]
[im 9/89  soft-tissue]
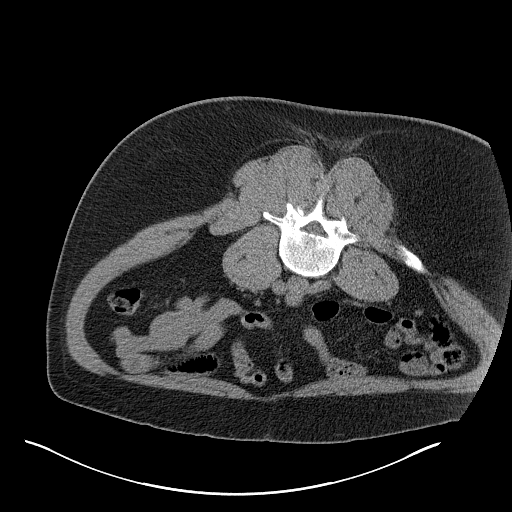
[im 9/89  bone]
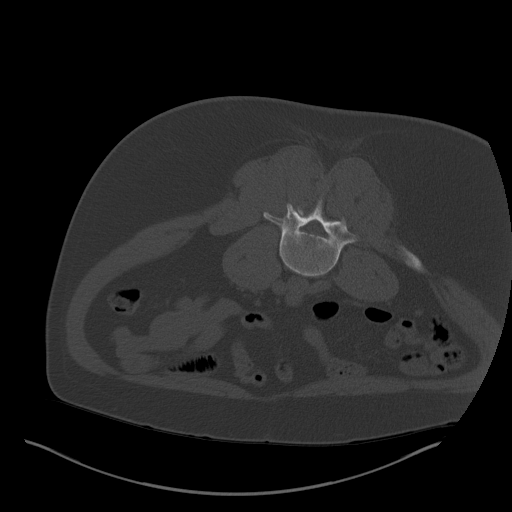
[im 18/89  soft-tissue]
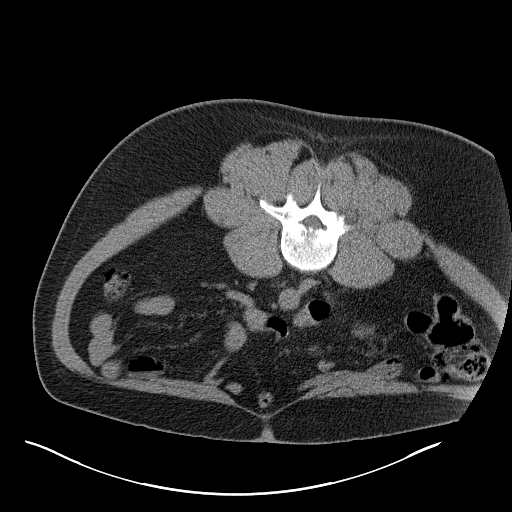
[im 27/89  soft-tissue]
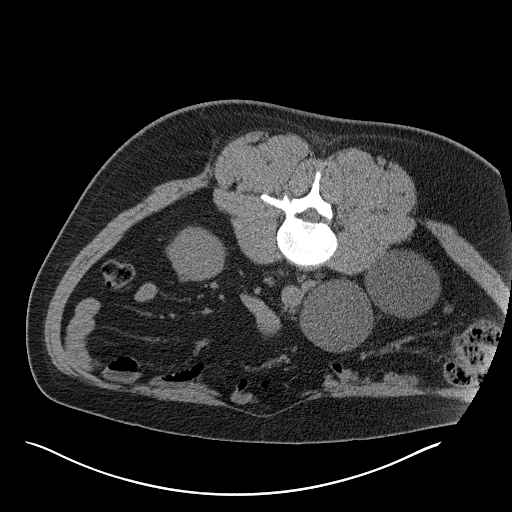
[im 36/89  soft-tissue]
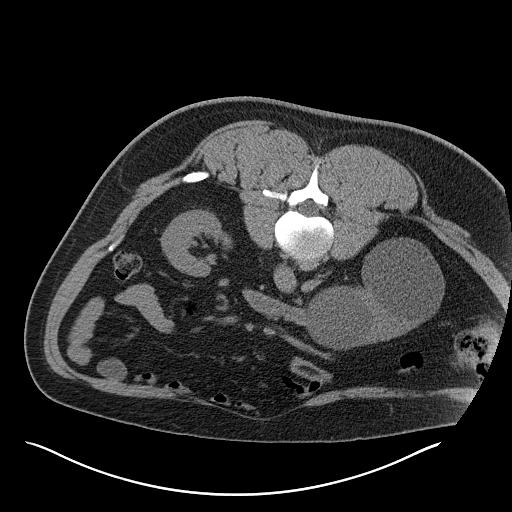
[im 45/89  soft-tissue]
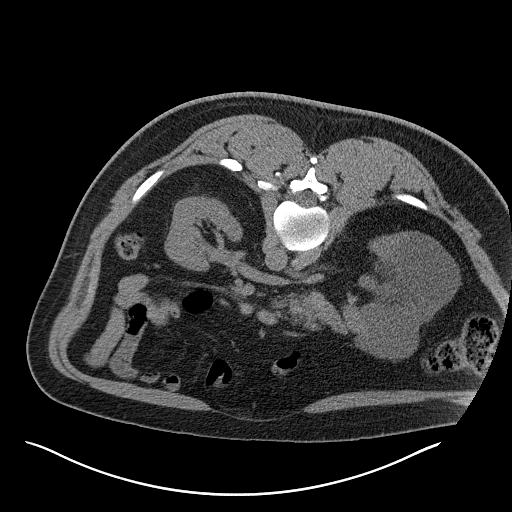
[im 53/89  soft-tissue]
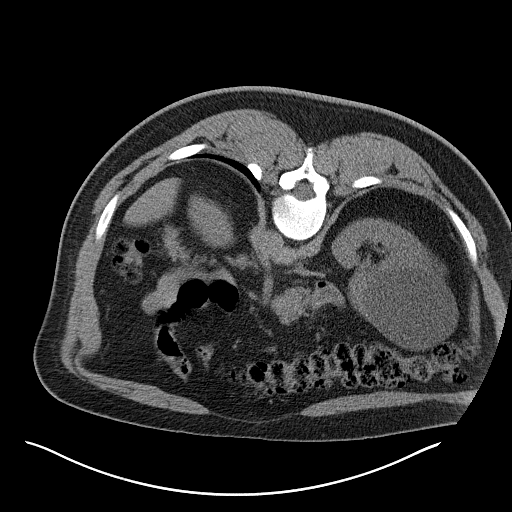
[im 53/89  lung]
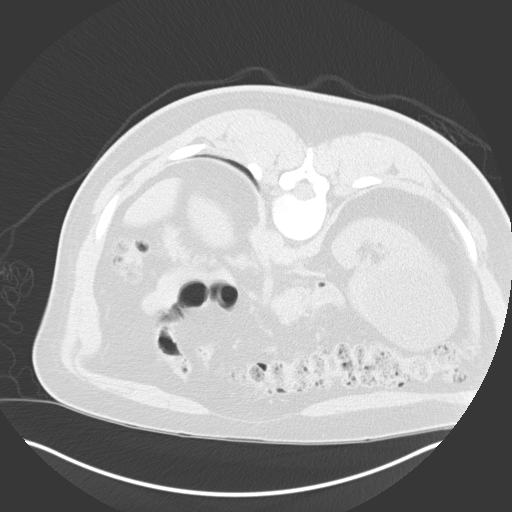
[im 62/89  soft-tissue]
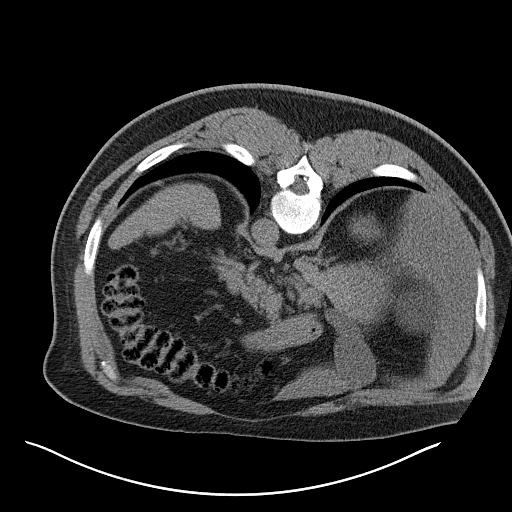
[im 62/89  lung]
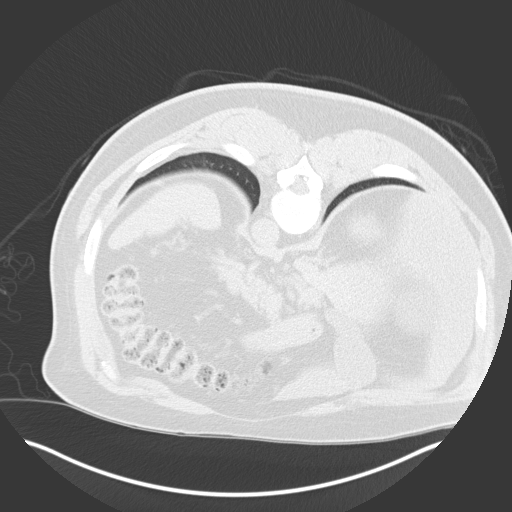
[im 71/89  soft-tissue]
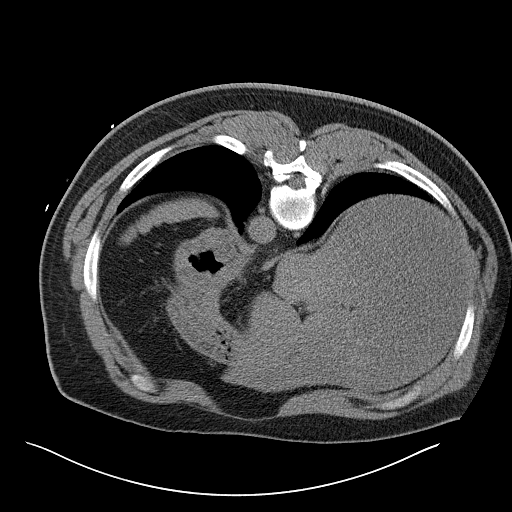
[im 71/89  lung]
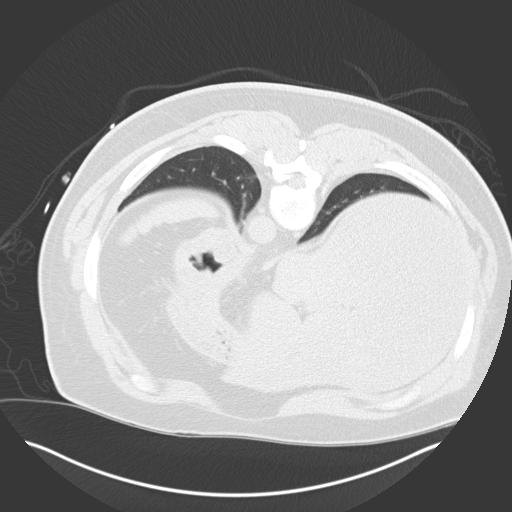
[im 80/89  soft-tissue]
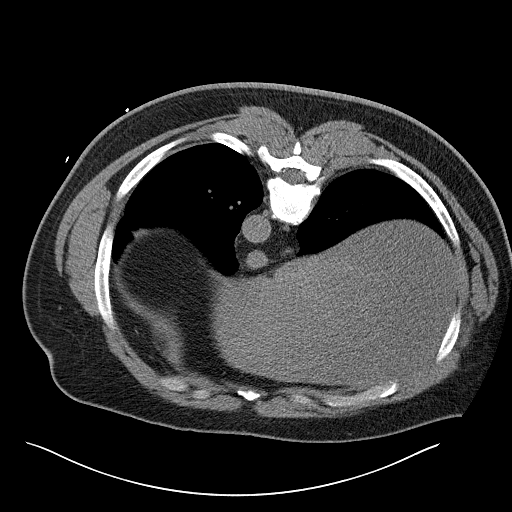
[im 80/89  lung]
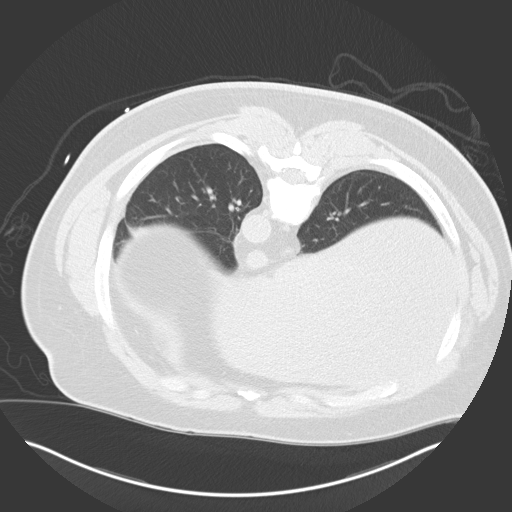
[im 80/89  bone]
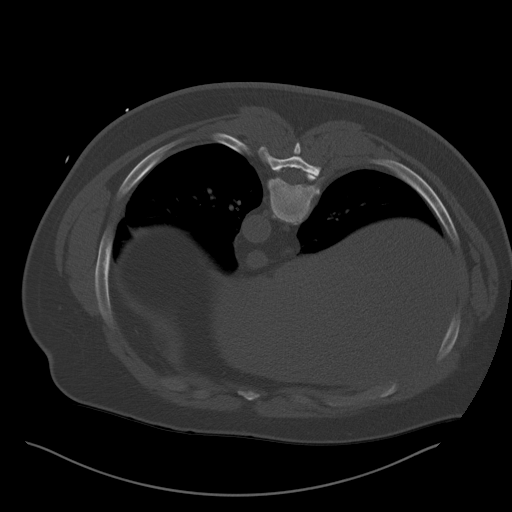

[9 of 32 positions shown; findings below may reference images not displayed]

RADIATION DOSE REDUCTION: This exam was performed according to the
departmental dose-optimization program which includes automated
exposure control, adjustment of the mA and/or kV according to
patient size and/or use of iterative reconstruction technique.

MEDICATIONS:
None

ANESTHESIA/SEDATION:
Moderate (conscious) sedation was employed during this procedure. A
total of Versed 2 mg and Fentanyl 100 mcg was administered
intravenously by the radiology nurse.

Total intra-service moderate Sedation Time: 34 minutes. The
patient's level of consciousness and vital signs were monitored
continuously by radiology nursing throughout the procedure under my
direct supervision.

COMPLICATIONS:
None immediate.

PROCEDURE:
Informed written consent was obtained from the patient after a
thorough discussion of the procedural risks, benefits and
alternatives. All questions were addressed. Maximal Sterile Barrier
Technique was utilized including caps, mask, sterile gowns, sterile
gloves, sterile drape, hand hygiene and skin antiseptic. A timeout
was performed prior to the initiation of the procedure.

Patient positioned prone on the CT table. The right flank was
prepped and draped in usual fashion. Following local lidocaine
administration, CT guidance was utilized to advance 18 gauge trocar
needle sequentially into the medial lower pole, posterior lower
pole, and upper pole cysts. Total of 600 mL of straw-colored fluid
was aspirated. The lower pole cysts completely resolved. Majority of
the upper pole cyst fluid was removed.

Patient tolerated procedure well. The fluid was sent for cytology as
requested.
IMPRESSION: CT-guided drainage of right medial lower pole, posterior lower pole,
and upper pole cysts. Lower poles cyst completely resolved. Some
fluid remains within the upper pole cyst. I did not reaccess the
upper pole cyst as it would have required crossing normal renal
parenchyma, and the remaining fluid did not justified the risk of
hemorrhage.
